# Patient Record
Sex: Female | Born: 1950 | Race: White | Hispanic: No | Smoking: Never smoker
Health system: Southern US, Community
[De-identification: ages and names within clinical notes are randomized; demographics above are authoritative.]

## PROBLEM LIST (undated history)

## (undated) DIAGNOSIS — K635 Polyp of colon: Secondary | ICD-10-CM

## (undated) DIAGNOSIS — E782 Mixed hyperlipidemia: Secondary | ICD-10-CM

## (undated) DIAGNOSIS — D72819 Decreased white blood cell count, unspecified: Principal | ICD-10-CM

## (undated) DIAGNOSIS — B009 Herpesviral infection, unspecified: Secondary | ICD-10-CM

## (undated) DIAGNOSIS — K2980 Duodenitis without bleeding: Secondary | ICD-10-CM

## (undated) DIAGNOSIS — Z1231 Encounter for screening mammogram for malignant neoplasm of breast: Secondary | ICD-10-CM

## (undated) DIAGNOSIS — R051 Acute cough: Secondary | ICD-10-CM

## (undated) DIAGNOSIS — K219 Gastro-esophageal reflux disease without esophagitis: Secondary | ICD-10-CM

## (undated) DIAGNOSIS — I10 Essential (primary) hypertension: Secondary | ICD-10-CM

## (undated) DIAGNOSIS — M8589 Other specified disorders of bone density and structure, multiple sites: Principal | ICD-10-CM

## (undated) DIAGNOSIS — Z79899 Other long term (current) drug therapy: Secondary | ICD-10-CM

## (undated) DIAGNOSIS — Z1211 Encounter for screening for malignant neoplasm of colon: Secondary | ICD-10-CM

## (undated) DIAGNOSIS — D709 Neutropenia, unspecified: Principal | ICD-10-CM

## (undated) DIAGNOSIS — Z8619 Personal history of other infectious and parasitic diseases: Secondary | ICD-10-CM

## (undated) DIAGNOSIS — E559 Vitamin D deficiency, unspecified: Secondary | ICD-10-CM

## (undated) DIAGNOSIS — Z9882 Breast implant status: Secondary | ICD-10-CM

## (undated) DIAGNOSIS — K2210 Ulcer of esophagus without bleeding: Secondary | ICD-10-CM

## (undated) DIAGNOSIS — K8020 Calculus of gallbladder without cholecystitis without obstruction: Secondary | ICD-10-CM

## (undated) DIAGNOSIS — D131 Benign neoplasm of stomach: Secondary | ICD-10-CM

## (undated) DIAGNOSIS — M1990 Unspecified osteoarthritis, unspecified site: Secondary | ICD-10-CM

## (undated) DIAGNOSIS — D649 Anemia, unspecified: Secondary | ICD-10-CM

## (undated) DIAGNOSIS — K5190 Ulcerative colitis, unspecified, without complications: Secondary | ICD-10-CM

## (undated) HISTORY — PX: FOOT SURGERY: SHX648

## (undated) HISTORY — PX: COSMETIC SURGERY: SHX468

## (undated) HISTORY — PX: COLONOSCOPY: SHX174

## (undated) HISTORY — PX: BREAST SURGERY: SHX581

## (undated) HISTORY — PX: AUGMENTATION MAMMAPLASTY: SUR837

## (undated) HISTORY — PX: OTHER SURGICAL HISTORY: SHX169

## (undated) HISTORY — PX: TUBAL LIGATION: SHX77

---

## 1998-04-11 ENCOUNTER — Ambulatory Visit (HOSPITAL_COMMUNITY): Admission: RE | Admit: 1998-04-11 | Discharge: 1998-04-11 | Payer: Self-pay | Admitting: Gastroenterology

## 2004-07-24 ENCOUNTER — Ambulatory Visit (HOSPITAL_COMMUNITY): Admission: RE | Admit: 2004-07-24 | Discharge: 2004-07-25 | Payer: Self-pay | Admitting: Specialist

## 2004-07-24 ENCOUNTER — Encounter (INDEPENDENT_AMBULATORY_CARE_PROVIDER_SITE_OTHER): Payer: Self-pay | Admitting: Specialist

## 2004-10-22 ENCOUNTER — Ambulatory Visit: Payer: Self-pay

## 2005-05-14 ENCOUNTER — Other Ambulatory Visit: Admission: RE | Admit: 2005-05-14 | Discharge: 2005-05-14 | Payer: Self-pay | Admitting: Family Medicine

## 2005-05-21 ENCOUNTER — Encounter: Admission: RE | Admit: 2005-05-21 | Discharge: 2005-05-21 | Payer: Self-pay | Admitting: Family Medicine

## 2006-05-27 ENCOUNTER — Other Ambulatory Visit: Admission: RE | Admit: 2006-05-27 | Discharge: 2006-05-27 | Payer: Self-pay | Admitting: Family Medicine

## 2006-07-14 ENCOUNTER — Encounter: Admission: RE | Admit: 2006-07-14 | Discharge: 2006-07-14 | Payer: Self-pay | Admitting: Family Medicine

## 2007-05-30 ENCOUNTER — Other Ambulatory Visit: Admission: RE | Admit: 2007-05-30 | Discharge: 2007-05-30 | Payer: Self-pay | Admitting: Family Medicine

## 2007-07-19 ENCOUNTER — Encounter: Admission: RE | Admit: 2007-07-19 | Discharge: 2007-07-19 | Payer: Self-pay | Admitting: Family Medicine

## 2008-05-29 ENCOUNTER — Other Ambulatory Visit: Admission: RE | Admit: 2008-05-29 | Discharge: 2008-05-29 | Payer: Self-pay | Admitting: Internal Medicine

## 2008-09-12 ENCOUNTER — Encounter: Admission: RE | Admit: 2008-09-12 | Discharge: 2008-09-12 | Payer: Self-pay | Admitting: Family Medicine

## 2009-05-29 ENCOUNTER — Other Ambulatory Visit: Admission: RE | Admit: 2009-05-29 | Discharge: 2009-05-29 | Payer: Self-pay | Admitting: Family Medicine

## 2010-11-26 ENCOUNTER — Encounter
Admission: RE | Admit: 2010-11-26 | Discharge: 2010-11-26 | Payer: Self-pay | Source: Home / Self Care | Attending: Internal Medicine | Admitting: Internal Medicine

## 2011-04-23 NOTE — Op Note (Signed)
Brianna Stevenson, Brianna Stevenson                             ACCOUNT NO.:  [id number]   MEDICAL RECORD NO.:  [id number]                   PATIENT TYPE:  OIB   LOCATION:  2550                                 FACILITY:  [facility]   PHYSICIAN:  Odella Aquas. Towanda Malkin, M.D.           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  She has 4+  diastasis recti.   PROCEDURES PLANNED:  1. Abdominoplasty with repair of diastasis recti.  2. Liposuction of the upper abdomen as well as the lateral abdomen and     ischial areas, inner and outer thighs.   ANESTHESIA:  General.   Preoperatively the patient was set up and drawn for all the areas of  concern.  A W-plasty incision was also outlined.  She then was placed back  on the operating room table.  She underwent general anesthesia and intubated  orally.  Prep was done to the chest, breast, abdominal, groin, and thigh  areas all the way down to the legs and feet with Hibiclens soap and solution  and walled off with sterile towels and drapes so as to make a sterile field.  Prep was done using the above.  Sterile towels and drapes were placed.  Tumescent was injected locally into all the areas of concern.  The W-plasty  incision was opened with a #15 blade down through the underlying  subcutaneous tissue.  Using a Bovie unit through Scarpa's fascia, we were  able to the tissue overlying the rectus and abdominal fascia.  A plane was  dissected up to the umbilicus.  The umbilicus was then released on its own  pedicle and the dissection carried out to the xiphoid process as well as the  upper right and left  quadrants.  After proper hemostasis, a liposuction was  fashioned to the right and left ischial areas, removing over 400 mL of fatty  tissue.  Next the upper abdomen was also liposuctioned lightly.  Examination  and after hemostasis was maintained showed severe diastasis recti.  This was  repaired with a running #1 Prolene suture from the xiphoid process to the  umbilicus and from the umbilicus to the suprapubic area with locking #1  Prolene suture.  Next the patient was placed in jackknife position and the  excess abdominal skin was excised.  Then after proper hemostasis the flaps  were laid in and secured with 2-0 Monocryl x2 layers and a running  subcuticular stitch of 3-0 Monocryl.  The new opening was made for the  umbilicus, and it was  brought through.  The area was defatted around it.  It  was secured with 3-0 Monocryl in the subcutaneous plane and then a running  subcuticular stitch of 3-0 Monocryl.  Next attention was drawn to the pubic  area.  A large Hemovac drain was placed through there, secured with 3-0  Prolene.  It was placed through the whole area of dissection of the abdomen.  Next a liposuction was fashioned over the right and left lateral inner thigh  using a [place] catheter 28-3 and 4's, removing copious amounts of  lipodystrophy with good symmetry, care being not to go too close to the skin  areas in the superficial planes.  After this the wounds were reclosed with 4-  0 Prolene, soft fluffy dressing applied to all the areas, Xeroform, 4 x 4's,  ABD, Hypafix tape, and an extra large abdominal support garment.  She  withstood all the procedures very well, was taken to recovery in excellent  condition.   ESTIMATED BLOOD LOSS:  400 mL.   COMPLICATIONS:  None.   The patient is to be admitted for overnight stay and then being discharged  on tomorrow.   ADDENDUM:  Prior to starting the procedure a Foley catheter was placed under  sterile conditions.                                                Odella Aquas. Towanda Malkin, M.D.    Elie Confer  D:  07/24/2004  T:  07/24/2004  Job:  [id number]

## 2011-04-23 NOTE — Discharge Summary (Signed)
NAME:  Brianna Stevenson, SHUTTER                             ACCOUNT NO.:  [id number]   MEDICAL RECORD NO.:  [id number]                   PATIENT TYPE:  OIB   LOCATION:  5708                                 FACILITY:  [facility]   PHYSICIAN:  Odella Aquas. Towanda Malkin, M.D.           DATE OF BIRTH:  01/11/1951   DATE OF ADMISSION:  07/24/2004  DATE OF DISCHARGE:                                 DISCHARGE SUMMARY   This is a 60 year old lady who has increased abdominal dermatochalasis with  diastasis recti, panniculus with lipodystrophy involving the ischial as well  as the right and left inner and outer thighs.  The patient underwent  abdominoplasty with repair of diastasis recti, liposuction of the ischial  areas as well as the right and left inner and outer thigh areas on July 24, 2004.  Postoperatively, she has done well, hemodynamically is very  stable, feels good, alert and oriented, urine output has been fine.  She is  being discharged today, July 25, 2004.   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   CONDITION ON DISCHARGE:  Improved.   COMPLICATIONS:  None.   FOLLOW UP:  Follow up in my office next Tuesday, the patient is to call the  office.   DISCHARGE MEDICATIONS:  She also has her preop and postop medications which  include Tylox 1 tablet by mouth every 4 hours as needed for pain, #30,  Duricef 500 mg twice a day for five days.  The patient is to call for any  medical problems.                                                Odella Aquas. Towanda Malkin, M.D.    Elie Confer  D:  07/25/2004  T:  07/25/2004  Job:  [id number]

## 2011-04-27 ENCOUNTER — Ambulatory Visit: Payer: Self-pay | Admitting: Family Medicine

## 2011-11-15 ENCOUNTER — Other Ambulatory Visit: Payer: Self-pay | Admitting: Internal Medicine

## 2011-11-15 DIAGNOSIS — Z1231 Encounter for screening mammogram for malignant neoplasm of breast: Secondary | ICD-10-CM

## 2011-12-06 ENCOUNTER — Ambulatory Visit
Admission: RE | Admit: 2011-12-06 | Discharge: 2011-12-06 | Disposition: A | Discharge status: Home / Self Care | Payer: Commercial Managed Care - PPO | Source: Ambulatory Visit | Attending: Internal Medicine | Admitting: Internal Medicine

## 2011-12-06 DIAGNOSIS — Z1231 Encounter for screening mammogram for malignant neoplasm of breast: Secondary | ICD-10-CM

## 2012-10-17 ENCOUNTER — Other Ambulatory Visit: Payer: Self-pay | Admitting: Internal Medicine

## 2012-10-17 DIAGNOSIS — Z1231 Encounter for screening mammogram for malignant neoplasm of breast: Secondary | ICD-10-CM

## 2012-12-07 ENCOUNTER — Ambulatory Visit
Admission: RE | Admit: 2012-12-07 | Discharge: 2012-12-07 | Disposition: A | Discharge status: Home / Self Care | Payer: Commercial Managed Care - PPO | Source: Ambulatory Visit | Attending: Internal Medicine | Admitting: Internal Medicine

## 2012-12-07 DIAGNOSIS — Z1231 Encounter for screening mammogram for malignant neoplasm of breast: Secondary | ICD-10-CM

## 2013-09-03 ENCOUNTER — Other Ambulatory Visit: Payer: Self-pay

## 2013-09-03 DIAGNOSIS — Z1231 Encounter for screening mammogram for malignant neoplasm of breast: Secondary | ICD-10-CM

## 2013-09-03 DIAGNOSIS — Z9882 Breast implant status: Secondary | ICD-10-CM

## 2013-12-07 ENCOUNTER — Ambulatory Visit
Admission: RE | Admit: 2013-12-07 | Discharge: 2013-12-07 | Disposition: A | Discharge status: Home / Self Care | Payer: 59 | Source: Ambulatory Visit

## 2013-12-07 DIAGNOSIS — Z9882 Breast implant status: Secondary | ICD-10-CM

## 2013-12-07 DIAGNOSIS — Z1231 Encounter for screening mammogram for malignant neoplasm of breast: Secondary | ICD-10-CM

## 2014-12-09 ENCOUNTER — Other Ambulatory Visit: Payer: Self-pay

## 2014-12-09 DIAGNOSIS — Z1231 Encounter for screening mammogram for malignant neoplasm of breast: Secondary | ICD-10-CM

## 2014-12-17 ENCOUNTER — Ambulatory Visit
Admission: RE | Admit: 2014-12-17 | Discharge: 2014-12-17 | Disposition: A | Discharge status: Home / Self Care | Payer: 59 | Source: Ambulatory Visit

## 2014-12-17 DIAGNOSIS — Z1231 Encounter for screening mammogram for malignant neoplasm of breast: Secondary | ICD-10-CM

## 2015-10-26 ENCOUNTER — Encounter: Payer: Self-pay | Admitting: Gynecology

## 2015-10-26 ENCOUNTER — Ambulatory Visit
Admission: EM | Admit: 2015-10-26 | Discharge: 2015-10-26 | Disposition: A | Discharge status: Home / Self Care | Payer: 59 | Attending: Family Medicine | Admitting: Family Medicine

## 2015-10-26 DIAGNOSIS — B349 Viral infection, unspecified: Secondary | ICD-10-CM | POA: Diagnosis not present

## 2015-10-26 HISTORY — DX: Vitamin D deficiency, unspecified: E55.9

## 2015-10-26 HISTORY — DX: Essential (primary) hypertension: I10

## 2015-10-26 HISTORY — DX: Mixed hyperlipidemia: E78.2

## 2015-10-26 HISTORY — DX: Ulcerative colitis, unspecified, without complications: K51.90

## 2015-10-26 LAB — RAPID STREP SCREEN (MED CTR MEBANE ONLY): Streptococcus, Group A Screen (Direct): NEGATIVE

## 2015-10-26 MED ORDER — IPRATROPIUM-ALBUTEROL 0.5-2.5 (3) MG/3ML IN SOLN
3.0000 mL | Freq: Once | RESPIRATORY_TRACT | Status: AC
  Administered 2015-10-26: 3 mL via RESPIRATORY_TRACT

## 2015-10-26 MED ORDER — FLUTICASONE PROPIONATE 50 MCG/ACT NA SUSP: 2.0000 | Freq: Every day | NASAL | Status: AC

## 2015-10-26 MED ORDER — KETOROLAC TROMETHAMINE 60 MG/2ML IM SOLN
60.0000 mg | Freq: Once | INTRAMUSCULAR | Status: AC
  Administered 2015-10-26: 60 mg via INTRAMUSCULAR

## 2015-10-26 NOTE — ED Notes (Addendum)
Patient c/o very sore throat / congestion and pulled muscle in back while shaving her legs.

## 2015-10-26 NOTE — ED Provider Notes (Signed)
CSN: [id number]     Arrival date & time 10/26/15  [id number] History   First MD Initiated Contact with Patient 10/26/15 [phone]     Chief Complaint  Patient presents with  . Sore Throat   (Consider location/radiation/quality/duration/timing/severity/associated sxs/prior Treatment) HPI  64 yo F with cough, congestion, sore throat  X 3 days. Got Doxycyline from PCP by phone on day 1- Rx continues. Also takes ampicillin daily for roseacea. Reports unable to eat x 1 day 2 sore throat. Took Aleve x 1 yesterday. Has Chertussin from husband she is using for cough- insists no improvement. Non smoker. No hx allergies Husband with same  Past Medical History  Diagnosis Date  . Ulcerative colitis ([place])   . Vitamin D deficiency disease   . Hypertension   . Mixed hyperlipidemia    Past Surgical History  Procedure Laterality Date  . Cesarean section    . Foot surgery    . Tummy tuck    . Cosmetic surgery      bilateral arms  . Planter wart    . Breast surgery     No family history on file. Social History  Substance Use Topics  . Smoking status: Never Smoker   . Smokeless tobacco: None  . Alcohol Use: No   OB History    No data available     Review of Systems  Constitutional: No fever.  Eyes: No visual changes. ENT: positive  sore throat. Cardiovascular:Negative for chest pain/palpitations Respiratory: Negative for shortness of breath, has cough Gastrointestinal: No abdominal pain. No nausea,vomiting, diarrhea Genitourinary: Negative for dysuria. Normal urination. Musculoskeletal: Positive for right low  back pain.-she believes pulled a muscle while shaving her legs yesterday  FROM extremities without pain Skin: Negative for rash Neurological: Negative for headache, focal weakness or numbness  Allergies  Review of patient's allergies indicates no known allergies.  Home Medications   Prior to Admission medications   Medication Sig Start Date End Date Taking? Authorizing Provider   amLODipine-benazepril (LOTREL) 5-20 MG capsule Take 1 capsule by mouth daily.   Yes Historical Provider, MD  ampicillin (PRINCIPEN) 500 MG capsule Take 500 mg by mouth 4 (four) times daily.   Yes Historical Provider, MD  doxycycline (VIBRAMYCIN) 100 MG capsule Take 100 mg by mouth 2 (two) times daily.   Yes Historical Provider, MD  ergocalciferol (VITAMIN D2) 50000 UNITS capsule Take 50,000 Units by mouth once a week.   Yes Historical Provider, MD  folic acid (FOLVITE) 1 MG tablet Take 1 mg by mouth daily.   Yes Historical Provider, MD  hydrochlorothiazide (HYDRODIURIL) 12.5 MG tablet Take 12.5 mg by mouth daily.   Yes Historical Provider, MD  meloxicam (MOBIC) 7.5 MG tablet Take 7.5 mg by mouth daily.   Yes Historical Provider, MD  metroNIDAZOLE (METROGEL) 1 % gel Apply topically daily.   Yes Historical Provider, MD  omeprazole (PRILOSEC) 40 MG capsule Take 40 mg by mouth daily.   Yes Historical Provider, MD  rosuvastatin (CRESTOR) 10 MG tablet Take 10 mg by mouth daily.   Yes Historical Provider, MD  sulfaSALAzine (AZULFIDINE) 500 MG tablet Take 500 mg by mouth 4 (four) times daily.   Yes Historical Provider, MD  fluticasone (FLONASE) 50 MCG/ACT nasal spray Place 2 sprays into both nostrils daily. 10/26/15   Jan Fireman, PA-C   Meds Ordered and Administered this Visit   Medications  ketorolac (TORADOL) injection 60 mg (60 mg Intramuscular Given 10/26/15 0928)  ipratropium-albuterol (DUONEB) 0.5-2.5 (3) MG/3ML nebulizer  solution 3 mL (3 mLs Nebulization Given 10/26/15 0928)    BP 135/76 mmHg  Pulse 83  Temp(Src) 98.4 F (36.9 C) (Oral)  Ht 5\' 1"  (1.549 m)  Wt 170 lb (77.111 kg)  BMI 32.14 kg/m2  SpO2 98% No data found.   Physical Exam  General: NAD, does not appear toxic, tired and not coping well-undermedicated for discomfort HEENT:normocephalic,atraumatic, mucous membranes moist,grossly normal hearing oopharnyx pale not eyrthematous, no tonsillar enlargement Eyes: EOMI,  conjunctiva clear, conjugate gaze Neck: supple,no lymphadenopathy Resp : CT A, bilat; normal respiratory effort, cough Card : RRR Back: FROM, amb without assistance, on and off table, palpable mild tenderness right low back Abd:  Not distended Skin: no rash, skin intact MSK: no deformities, ambulatory without assistance Neuro : good attention,recall-good memory, no focal neuro deficits Psych: speech and behavior appropriate   ED Course  Procedures (including critical care time)  Labs Review Labs Reviewed  RAPID STREP SCREEN (NOT AT [facility])  CULTURE, GROUP A STREP ([facility] ONLY)   Stat Neg  Imaging Review No results found.      MDM   1. Viral syndrome    Plan:   Diagnosis reviewed with patient--Seasonal Allergies suspected  Along with Viral syndrome  Rx Fluticasone,loratadine ; - twice daily for 3 days then reduce to daily   Add OTC Guaifenesin DM to thin mucous;increase po fluids  Recommend supportive treatment with OTC tylenol/ibuprofen/fluids-heat pad to back on low prn while awake  Risks ,benefits,potential side effects reviewed  Seek additional medical care if symptoms worsen or don't improve  Call in 3 days for final Strep report   Jan Fireman, PA-C 11/01/15 [phone]

## 2015-10-26 NOTE — Discharge Instructions (Signed)
Claritin 10 mg 1   day   Fluticasone 1 spray per nostril twice daily for 3 days then reduce to daily  Ibuporfen 200 mg     3 tablets   3 x day with meals  X 3 days for discomfort   STOP IF YOU GET STOMACH UPSET  Tylenol 325 or 500 mg 2 tablets every 6 hours as needed for pain alternating with ibuprofen  Robitussin DM  ( Guaifenesin DM )  1 tsp every 4 hours -- liquefy  mucous and decrease coughing  Ice pack to back for 2 -3 days  Then OK to  Try heat pad   Do Not go to bedrest...stay up and active---steamy showers---bubble bath  :) Viral Infections A viral infection can be caused by different types of viruses.Most viral infections are not serious and resolve on their own. However, some infections may cause severe symptoms and may lead to further complications. SYMPTOMS Viruses can frequently cause:  Minor sore throat.  Aches and pains.  Headaches.  Runny nose.  Different types of rashes.  Watery eyes.  Tiredness.  Cough.  Loss of appetite.  Gastrointestinal infections, resulting in nausea, vomiting, and diarrhea. These symptoms do not respond to antibiotics because the infection is not caused by bacteria. However, you might catch a bacterial infection following the viral infection. This is sometimes called a "superinfection." Symptoms of such a bacterial infection may include:  Worsening sore throat with pus and difficulty swallowing.  Swollen neck glands.  Chills and a high or persistent fever.  Severe headache.  Tenderness over the sinuses.  Persistent overall ill feeling (malaise), muscle aches, and tiredness (fatigue).  Persistent cough.  Yellow, green, or brown mucus production with coughing. HOME CARE INSTRUCTIONS   Only take over-the-counter or prescription medicines for pain, discomfort, diarrhea, or fever as directed by your caregiver.  Drink enough water and fluids to keep your urine clear or pale yellow. Sports drinks can provide valuable  electrolytes, sugars, and hydration.  Get plenty of rest and maintain proper nutrition. Soups and broths with crackers or rice are fine. SEEK IMMEDIATE MEDICAL CARE IF:   You have severe headaches, shortness of breath, chest pain, neck pain, or an unusual rash.  You have uncontrolled vomiting, diarrhea, or you are unable to keep down fluids.  You or your child has an oral temperature above 102 F (38.9 C), not controlled by medicine.  Your baby is older than 3 months with a rectal temperature of 102 F (38.9 C) or higher.  Your baby is 59 months old or younger with a rectal temperature of 100.4 F (38 C) or higher. MAKE SURE YOU:   Understand these instructions.  Will watch your condition.  Will get help right away if you are not doing well or get worse.   This information is not intended to replace advice given to you by your health care provider. Make sure you discuss any questions you have with your health care provider.   Document Released: 09/01/2005 Document Revised: 02/14/2012 Document Reviewed: 04/30/2015 Elsevier Interactive Patient Education [employer].

## 2015-10-28 LAB — CULTURE, GROUP A STREP (THRC)

## 2015-11-25 ENCOUNTER — Other Ambulatory Visit: Payer: Self-pay | Admitting: Family Medicine

## 2015-11-25 ENCOUNTER — Other Ambulatory Visit: Payer: Self-pay

## 2015-11-25 DIAGNOSIS — Z780 Asymptomatic menopausal state: Secondary | ICD-10-CM

## 2015-11-25 DIAGNOSIS — Z1231 Encounter for screening mammogram for malignant neoplasm of breast: Secondary | ICD-10-CM

## 2015-12-24 ENCOUNTER — Other Ambulatory Visit: Payer: Self-pay | Admitting: Family Medicine

## 2015-12-24 DIAGNOSIS — Z780 Asymptomatic menopausal state: Secondary | ICD-10-CM

## 2015-12-26 ENCOUNTER — Ambulatory Visit
Admission: RE | Admit: 2015-12-26 | Discharge: 2015-12-26 | Disposition: A | Discharge status: Home / Self Care | Payer: 59 | Source: Ambulatory Visit

## 2015-12-26 ENCOUNTER — Ambulatory Visit
Admission: RE | Admit: 2015-12-26 | Discharge: 2015-12-26 | Disposition: A | Discharge status: Home / Self Care | Payer: 59 | Source: Ambulatory Visit | Attending: Family Medicine | Admitting: Family Medicine

## 2015-12-26 DIAGNOSIS — Z780 Asymptomatic menopausal state: Secondary | ICD-10-CM

## 2015-12-26 DIAGNOSIS — Z1231 Encounter for screening mammogram for malignant neoplasm of breast: Secondary | ICD-10-CM

## 2016-10-06 NOTE — Discharge Instructions (Signed)
[facility] °[facility] ° °POST OPERATIVE INSTRUCTIONS FOR DR. TROXLER AND DR. FOWLER °[facility] ° ° °1. Take your medication as prescribed.  Pain medication should be taken only as needed. ° °2. Keep the dressing clean, dry and intact. ° °3. Keep your foot elevated above the heart level for the first 48 hours. ° °4. Walking to the bathroom and brief periods of walking are acceptable, unless we have instructed you to be non-weight bearing. ° °5. Always wear your post-op shoe when walking.  Always use your crutches if you are to be non-weight bearing. ° °6. Do not take a shower. Baths are permissible as long as the foot is kept out of the water.  ° °7. Every hour you are awake:  °- Bend your knee 15 times. °- Flex foot 15 times °- Massage calf 15 times ° °8. Call [facility] ([phone]) if any of the following problems occur: °- You develop a temperature or fever. °- The bandage becomes saturated with blood. °- Medication does not stop your pain. °- Injury of the foot occurs. °- Any symptoms of infection including redness, odor, or red streaks running from wound. ° °General Anesthesia, Adult, Care After °Refer to this sheet in the next few weeks. These instructions provide you with information on caring for yourself after your procedure. Your health care provider may also give you more specific instructions. Your treatment has been planned according to current medical practices, but problems sometimes occur. Call your health care provider if you have any problems or questions after your procedure. °WHAT TO EXPECT AFTER THE PROCEDURE °After the procedure, it is typical to experience: °· Sleepiness. °· Nausea and vomiting. °HOME CARE INSTRUCTIONS °· For the first 24 hours after general anesthesia: °¨ Have a responsible person with you. °¨ Do not drive a car. If you are alone, do not take public transportation. °¨ Do not drink alcohol. °¨ Do not take  medicine that has not been prescribed by your health care provider. °¨ Do not sign important papers or make important decisions. °¨ You may resume a normal diet and activities as directed by your health care provider. °· Change bandages (dressings) as directed. °· If you have questions or problems that seem related to general anesthesia, call the hospital and ask for the anesthetist or anesthesiologist on call. °SEEK MEDICAL CARE IF: °· You have nausea and vomiting that continue the day after anesthesia. °· You develop a rash. °SEEK IMMEDIATE MEDICAL CARE IF:  °· You have difficulty breathing. °· You have chest pain. °· You have any allergic problems. °  °This information is not intended to replace advice given to you by your health care provider. Make sure you discuss any questions you have with your health care provider. °  °Document Released: 02/28/2001 Document Revised: 12/13/2014 Document Reviewed: 03/22/2012 °Elsevier Interactive Patient Education ©2016 Elsevier Inc. ° °

## 2016-10-14 ENCOUNTER — Encounter
Admission: RE | Disposition: A | Discharge status: Home / Self Care | Payer: Self-pay | Source: Ambulatory Visit | Attending: Podiatry

## 2016-10-14 ENCOUNTER — Ambulatory Visit
Admission: RE | Admit: 2016-10-14 | Discharge: 2016-10-14 | Disposition: A | Discharge status: Home / Self Care | Payer: 59 | Source: Ambulatory Visit | Attending: Podiatry | Admitting: Podiatry

## 2016-10-14 ENCOUNTER — Ambulatory Visit: Payer: 59 | Admitting: Anesthesiology

## 2016-10-14 DIAGNOSIS — M2041 Other hammer toe(s) (acquired), right foot: Secondary | ICD-10-CM | POA: Diagnosis present

## 2016-10-14 DIAGNOSIS — M19071 Primary osteoarthritis, right ankle and foot: Secondary | ICD-10-CM | POA: Insufficient documentation

## 2016-10-14 DIAGNOSIS — G8929 Other chronic pain: Secondary | ICD-10-CM | POA: Diagnosis not present

## 2016-10-14 DIAGNOSIS — M2021 Hallux rigidus, right foot: Secondary | ICD-10-CM | POA: Insufficient documentation

## 2016-10-14 DIAGNOSIS — I10 Essential (primary) hypertension: Secondary | ICD-10-CM | POA: Insufficient documentation

## 2016-10-14 HISTORY — DX: Unspecified osteoarthritis, unspecified site: M19.90

## 2016-10-14 HISTORY — PX: METATARSAL OSTEOTOMY: SHX1641

## 2016-10-14 HISTORY — DX: Gastro-esophageal reflux disease without esophagitis: K21.9

## 2016-10-14 HISTORY — PX: ARTHRODESIS METATARSALPHALANGEAL JOINT (MTPJ): SHX6566

## 2016-10-14 HISTORY — DX: Anemia, unspecified: D64.9

## 2016-10-14 HISTORY — PX: HAMMER TOE SURGERY: SHX385

## 2016-10-14 SURGERY — FUSION, JOINT, GREAT TOE
Anesthesia: Regional | Site: Foot | Laterality: Right | Wound class: Clean

## 2016-10-14 MED ORDER — BUPIVACAINE HCL (PF) 0.5 % IJ SOLN
INTRAMUSCULAR | Status: DC | PRN
  Administered 2016-10-14: 4 mL

## 2016-10-14 MED ORDER — DEXAMETHASONE SODIUM PHOSPHATE 4 MG/ML IJ SOLN
INTRAMUSCULAR | Status: DC | PRN
  Administered 2016-10-14: 4 mg via INTRAVENOUS

## 2016-10-14 MED ORDER — LIDOCAINE HCL (CARDIAC) 20 MG/ML IV SOLN
INTRAVENOUS | Status: DC | PRN
  Administered 2016-10-14: 50 mg via INTRATRACHEAL

## 2016-10-14 MED ORDER — EPHEDRINE SULFATE 50 MG/ML IJ SOLN
INTRAMUSCULAR | Status: DC | PRN
  Administered 2016-10-14 (×3): 10 mg via INTRAVENOUS

## 2016-10-14 MED ORDER — OXYCODONE HCL 5 MG PO TABS: 5.0000 mg | ORAL_TABLET | Freq: Once | ORAL | Status: DC | PRN

## 2016-10-14 MED ORDER — PROMETHAZINE HCL 25 MG/ML IJ SOLN: 6.2500 mg | INTRAMUSCULAR | Status: DC | PRN

## 2016-10-14 MED ORDER — OXYCODONE HCL 5 MG/5ML PO SOLN: 5.0000 mg | Freq: Once | ORAL | Status: DC | PRN

## 2016-10-14 MED ORDER — PROPOFOL 10 MG/ML IV BOLUS
INTRAVENOUS | Status: DC | PRN
  Administered 2016-10-14: 180 mg via INTRAVENOUS

## 2016-10-14 MED ORDER — OXYCODONE-ACETAMINOPHEN 7.5-325 MG PO TABS: 1.0000 | ORAL_TABLET | ORAL | 0 refills | Status: DC | PRN

## 2016-10-14 MED ORDER — ROPIVACAINE HCL 5 MG/ML IJ SOLN
INTRAMUSCULAR | Status: DC | PRN
  Administered 2016-10-14: 200 mg via PERINEURAL

## 2016-10-14 MED ORDER — LACTATED RINGERS IV SOLN
INTRAVENOUS | Status: DC
  Administered 2016-10-14: 09:00:00 via INTRAVENOUS

## 2016-10-14 MED ORDER — DEXTROSE 5 % IV SOLN
2000.0000 mg | Freq: Once | INTRAVENOUS | Status: AC
  Administered 2016-10-14: 2000 mg via INTRAVENOUS

## 2016-10-14 MED ORDER — PROMETHAZINE HCL 12.5 MG PO TABS: 12.5000 mg | ORAL_TABLET | Freq: Four times a day (QID) | ORAL | 0 refills | Status: DC | PRN

## 2016-10-14 MED ORDER — MIDAZOLAM HCL 2 MG/2ML IJ SOLN
INTRAMUSCULAR | Status: DC | PRN
  Administered 2016-10-14 (×2): 2 mg via INTRAVENOUS

## 2016-10-14 MED ORDER — MEPERIDINE HCL 25 MG/ML IJ SOLN: 6.2500 mg | INTRAMUSCULAR | Status: DC | PRN

## 2016-10-14 MED ORDER — FENTANYL CITRATE (PF) 100 MCG/2ML IJ SOLN
INTRAMUSCULAR | Status: DC | PRN
  Administered 2016-10-14: 100 ug via INTRAVENOUS

## 2016-10-14 MED ORDER — HYDROMORPHONE HCL 1 MG/ML IJ SOLN: 0.2500 mg | INTRAMUSCULAR | Status: DC | PRN

## 2016-10-14 MED ORDER — ONDANSETRON HCL 4 MG/2ML IJ SOLN
INTRAMUSCULAR | Status: DC | PRN
  Administered 2016-10-14: 4 mg via INTRAVENOUS

## 2016-10-14 SURGICAL SUPPLY — 98 items
0.045 K WIRE (Wire) ×1 IMPLANT
1.2 MM WIRE ×2 IMPLANT
19 MM CUP REAMER ×1 IMPLANT
19MM CONE REAMER ×1 IMPLANT
3.5 MM COUNTERSINK ×1 IMPLANT
APL SKNCLS STERI-STRIP NONHPOA (GAUZE/BANDAGES/DRESSINGS) ×1
BANDAGE ELASTIC 4 LF NS (GAUZE/BANDAGES/DRESSINGS) ×2 IMPLANT
BENZOIN TINCTURE PRP APPL 2/3 (GAUZE/BANDAGES/DRESSINGS) ×2 IMPLANT
BIT DRILL 1.7 LNG CANN (DRILL) ×1 IMPLANT
BIT DRILL 2 FENESTRATED (MISCELLANEOUS) IMPLANT
BIT DRILL CANNULATED 2.3MM (DRILL) IMPLANT
BIT DRILLL 2 FENESTRATED (MISCELLANEOUS) ×1
BLADE CRESCENTIC (BLADE) IMPLANT
BLADE MED AGGRESSIVE (BLADE) IMPLANT
BLADE MINI RND TIP GREEN BEAV (BLADE) IMPLANT
BLADE OSC/SAGITTAL 5.5X25 (BLADE) IMPLANT
BLADE OSC/SAGITTAL MD 5.5X18 (BLADE) IMPLANT
BLADE OSC/SAGITTAL MD 9X18.5 (BLADE) ×1 IMPLANT
BLADE OSCILLATING/SAGITTAL (BLADE) ×2
BLADE SW THK.38XMED LNG THN (BLADE) IMPLANT
BNDG CMPR 75X41 PLY HI ABS (GAUZE/BANDAGES/DRESSINGS) ×1
BNDG CMPR MED 5X4 ELC HKLP NS (GAUZE/BANDAGES/DRESSINGS) ×1
BNDG ESMARK 4X12 TAN STRL LF (GAUZE/BANDAGES/DRESSINGS) ×2 IMPLANT
BNDG GAUZE 4.5X4.1 6PLY STRL (MISCELLANEOUS) ×2 IMPLANT
BNDG STRETCH 4X75 STRL LF (GAUZE/BANDAGES/DRESSINGS) ×2 IMPLANT
BUR EGG 4X8 MED (BURR) IMPLANT
BUR STRYKR EGG 5.0 (BURR) IMPLANT
BUR SURG 4X8 MED (BURR) IMPLANT
BURR SURG 4X8 MED (BURR) ×2
CANISTER SUCT 1200ML W/VALVE (MISCELLANEOUS) ×2 IMPLANT
CAST PADDING 3X4FT ST 30246 (SOFTGOODS)
CNTRSNK DRL 2 HDLS SCR (MISCELLANEOUS) IMPLANT
COUNTERSINK 2.0 (MISCELLANEOUS) ×2
COVER LIGHT HANDLE UNIVERSAL (MISCELLANEOUS) ×4 IMPLANT
COVER PIN YLW 0.028-062 (MISCELLANEOUS) IMPLANT
CUFF TOURN SGL QUICK 18 (TOURNIQUET CUFF) IMPLANT
DRAPE FLUOR MINI C-ARM 54X84 (DRAPES) ×2 IMPLANT
DRILL CANNULATED 2.3MM (DRILL) ×2
DRILL WIRE PASS (DRILL) IMPLANT
DURAPREP 26ML APPLICATOR (WOUND CARE) ×2 IMPLANT
GAUZE PETRO XEROFOAM 1X8 (MISCELLANEOUS) ×2 IMPLANT
GAUZE SPONGE 4X4 12PLY STRL (GAUZE/BANDAGES/DRESSINGS) ×2 IMPLANT
GLOVE BIO SURGEON STRL SZ8 (GLOVE) ×2 IMPLANT
GOWN STRL REUS W/ TWL LRG LVL3 (GOWN DISPOSABLE) ×1 IMPLANT
GOWN STRL REUS W/ TWL XL LVL3 (GOWN DISPOSABLE) ×1 IMPLANT
GOWN STRL REUS W/TWL LRG LVL3 (GOWN DISPOSABLE) ×2
GOWN STRL REUS W/TWL XL LVL3 (GOWN DISPOSABLE) ×2
K-WIRE .9X150 (WIRE) ×8
K-WIRE DBL END TROCAR 6X.045 (WIRE) ×2
K-WIRE DBL END TROCAR 6X.062 (WIRE)
K-WIRE SMOOTH 1.6X150MM (WIRE) ×4
KIT ROOM TURNOVER OR (KITS) ×2 IMPLANT
KWIRE .9X150 (WIRE) IMPLANT
KWIRE DBL END TROCAR 6X.045 (WIRE) IMPLANT
KWIRE DBL END TROCAR 6X.062 (WIRE) IMPLANT
KWIRE SMOOTH 1.6X150MM (WIRE) IMPLANT
NDL HYPO 18GX1.5 BLUNT FILL (NEEDLE) IMPLANT
NDL HYPO 25GX1X1/2 BEV (NEEDLE) IMPLANT
NEEDLE HYPO 18GX1.5 BLUNT FILL (NEEDLE) IMPLANT
NEEDLE HYPO 25GX1X1/2 BEV (NEEDLE) IMPLANT
NS IRRIG 500ML POUR BTL (IV SOLUTION) ×2 IMPLANT
PACK EXTREMITY ARMC (MISCELLANEOUS) ×2 IMPLANT
PAD CAST CTTN 3X4 STRL (SOFTGOODS) IMPLANT
PAD GROUND ADULT SPLIT (MISCELLANEOUS) ×2 IMPLANT
PADDING CAST COTTON 3X4 STRL (SOFTGOODS)
PLATE MTP 0DEG RIGHT (Plate) ×1 IMPLANT
RASP SM TEAR CROSS CUT (RASP) ×2 IMPLANT
SCREW 2.7 NON LOCK (Screw) ×1 IMPLANT
SCREW 2.7 X 13 NON LOCK (Screw) ×3 IMPLANT
SCREW 2.7 X 15 NON LOCK (Screw) ×1 IMPLANT
SCREW 3.5 X 26ST (Screw) ×1 IMPLANT
SCREW CANN ST 3.5X26 (Screw) IMPLANT
SCREW HEADLESS 2.0X14MM (Screw) ×1 IMPLANT
SCREW HEADLESS SHRT THRD 2X10 (Screw) ×1 IMPLANT
SCREW LOCK PLATE R3 2.7X12 (Screw) ×1 IMPLANT
SCREW LOCK PLATE R3 2.7X15 (Screw) ×2 IMPLANT
SCREW LOCK PLATE R3 2.7X18 (Screw) ×1 IMPLANT
SPLINT CAST 1 STEP 4X30 (MISCELLANEOUS) ×2 IMPLANT
SPLINT FAST PLASTER 5X30 (CAST SUPPLIES)
SPLINT PLASTER CAST FAST 5X30 (CAST SUPPLIES) IMPLANT
STOCKINETTE STRL 6IN 960660 (GAUZE/BANDAGES/DRESSINGS) ×2 IMPLANT
STRAP BODY AND KNEE 60X3 (MISCELLANEOUS) ×2 IMPLANT
STRIP CLOSURE SKIN 1/4X4 (GAUZE/BANDAGES/DRESSINGS) ×2 IMPLANT
SUT ETHILON 4-0 (SUTURE)
SUT ETHILON 4-0 FS2 18XMFL BLK (SUTURE)
SUT ETHILON 5-0 FS-2 18 BLK (SUTURE) ×1 IMPLANT
SUT VIC AB 1 CT1 36 (SUTURE) IMPLANT
SUT VIC AB 2-0 CT1 27 (SUTURE)
SUT VIC AB 2-0 CT1 TAPERPNT 27 (SUTURE) IMPLANT
SUT VIC AB 2-0 SH 27 (SUTURE)
SUT VIC AB 2-0 SH 27XBRD (SUTURE) IMPLANT
SUT VIC AB 3-0 SH 27 (SUTURE)
SUT VIC AB 3-0 SH 27X BRD (SUTURE) IMPLANT
SUT VIC AB 4-0 FS2 27 (SUTURE) ×1 IMPLANT
SUT VICRYL AB 3-0 FS1 BRD 27IN (SUTURE) IMPLANT
SUTURE ETHLN 4-0 FS2 18XMF BLK (SUTURE) IMPLANT
SYRINGE 10CC LL (SYRINGE) IMPLANT
WIRE OLIVE SMOOTH 1.4MMX60MM (WIRE) ×1 IMPLANT

## 2016-10-14 NOTE — H&P (Signed)
H and P has been reviewed and no changes are noted.  

## 2016-10-14 NOTE — Op Note (Signed)
Operative note   Surgeon: Dr. Albertine Patricia, DPM.    Assistant: None    Preop diagnosis: 1. Hallux rigidus right foot 2. Plantar displaced second metatarsal right foot 3. Hammertoe deformity second toe right foot    Postop diagnosis: Same    Procedure:   1. Arthrodesis first metatarsal phalangeal joint with plate and screw fixation   2. Osteotomy second metatarsal with 2 screw fixation   3. Hammertoe repair second toe right foot with K wire fixation     EBL: Less than 10 cc    Anesthesia:general with a popliteal block delivered about anesthesia team. I delivered 5 cc of Marcaine plain around the base of the first metatarsal and second metatarsal at the end of the surgery.    Hemostasis: Ankle tourniquet 250 mils mercury for 90 minutes    Specimen: Degenerative bone and cartilage from right first metatarsophalangeal joint    Complications: None    Operative indications: Chronic pain and deformity and arthritic changes to the first MTP joint with secondary pain and discomfort and hammertoe formation and capsulitis to the second MTP joint. Conservative treatment proven effective.    Procedure:  Patient was brought into the OR and placed on the operating table in thesupine position. After anesthesia was obtained theright lower extremity was prepped and draped in usual sterile fashion.  Operative Report: This time attention directed to the dorsum of the right first MTP joint where a 5 cm dorsal or skin incision made and deepened sharp dissection bleeders clamped and bovied as required. Tissue was identified and incised longitudinally and reflected from the dorsal medial lateral aspects of the metatarsophalangeal joint. Significant on degeneration of proliferation was noted across both the metatarsal head and the base of the proximal phalanx. The hypertrophied bone was resected utilizing power equipment Roger and rasping. A sagittal saw was used to remove some medial prominence and  dorsal and dorsal medial prominence as well from the first metatarsal head. At this time there was copiously irrigated and a reamer and Cone system were utilized to remove the cartilage from both aspects of the joint. Further subchondral bone was denuded utilizing a bur. Once good subchondral bone was available the area was drilled with a 2.0 drill bit in order to fenestrate and allow for better bone fusion. The toe was placed in a corrected position temporally fixated with a K wire from dorsal to plantar and also a K wire was placed into allow for screw placement. This time a crossing screw from [employer]agon set 3.5 x 26 cannulae screw was placed across the area. Good fixation and position was noted. The position toe was good at this juncture so a plate was placed across the area from the  Onset also. Was a medium metatarsal phalangeal plate with distal screws 2.7 x 13 mm locking screw. Proximal screws included 22.7 x 15 locking screws and 1 nonlocking screw 2.7 x 16 mm. Once this is in place the areas checked FluoroScan excellent position correction and coaptation of the 2 fusion fragments were noted. There is an copiously irrigated and capsule tissue was enclosed with 4 Vicryl continuous stitch as were deep superficial fascial layers and closed with 4 Vicryl in a subcuticular fashion.  This time a curvilinear incision made over the second metatarsophalangeal joint extended to the dorsum of the second toe over the PIP joint. The incision was then deepened sharp blunt dissection bleeders clamped and bovied as required. Extensor tendon over the MTP joint was identified and extensor hood  release was performed and the tendon was contracted laterally. Tissue was then incised and freed mediolaterally. An osteotomy was then performed and second metatarsal at the osteochondral junction from dorsal distal plantar proximal. Metatarsal head was then shifted to a more medial position fixated with 2 K wires from the Paragon set  this 0.2 screws were placed across there including 2.0 x 14 less than another 2.0 x 10 mm headless. There is checked FluoroScan good position correction and metatarsal parabola were noted.  At this time the second toe was addressed and a Beaver blade was used to incise the extensor tendon transversely the PIPJ. The tendon was cleared away from the base of the middle phalanx and the head of the proximal phalanx were cartilage was then resected utilizing a sagittal saw. Once is accomplished a 0.5 K wires drilled through the middle distal phalanges and retrograded in the proximal phalanx while holding toe in good apposition. K wires then traversed across the metatarsophalangeal joint holding the toe in a plantar and lateral position to allow for correction of some of the structural deformity that occurred. There is checked FluoroScan good position correction were noted. There was copiously irrigated extensor tendon over the joint was then sutured with 4 Vicryl simple ruptured sutures 3 sutures were utilized. Proximally the incision was closed Level with 4 Vicryl in Continuous Stitch As Were Deep Superficial Fascial Layers. Skin Was Closed Proximally with 4 Vicryl Subcuticular Fashion. Over the Toes Skin Was Closed with 5-0 Nylon Horizontal Mattress Sutures.  At This Time There Is Black 0.5% Marcaine Plain and Sterile Compressive Dressings Placed across Wound Consisting of Steri-Strips Gauze 4 X 4's Kling and Kerlix. The Tourniquet Been Released Prior to Closure of the Second Toe and Second Metatarsal Wound. Good Bleeding Return of Vascularity Was Noted. A Posterior Splint Was Placed on the Right Foot Leg in the Operating Room.    Patient tolerated the procedure and anesthesia well.  Was transported from the OR to the PACU with all vital signs stable and vascular status intact. To be discharged per routine protocol.  Will follow up in approximately 1 week in the outpatient clinic.

## 2016-10-14 NOTE — Anesthesia Procedure Notes (Signed)
Procedure Name: LMA Insertion Date/Time: 10/14/2016 9:57 AM Performed by: Cameron Ali Pre-anesthesia Checklist: Patient identified, Emergency Drugs available, Suction available, Timeout performed and Patient being monitored Patient Re-evaluated:Patient Re-evaluated prior to inductionOxygen Delivery Method: Circle system utilized Preoxygenation: Pre-oxygenation with 100% oxygen Intubation Type: IV induction LMA: LMA inserted LMA Size: 4.0 Number of attempts: 1 Placement Confirmation: positive ETCO2 and breath sounds checked- equal and bilateral Tube secured with: Tape

## 2016-10-14 NOTE — Anesthesia Preprocedure Evaluation (Signed)
Anesthesia Evaluation  Patient identified by MRN, date of birth, ID band Patient awake    Reviewed: Allergy & Precautions, NPO status , Patient's Chart, lab work & pertinent test results  Airway Mallampati: II  TM Distance: >3 FB Neck ROM: Full    Dental no notable dental hx.    Pulmonary neg pulmonary ROS,    Pulmonary exam normal        Cardiovascular hypertension, Pt. on medications Normal cardiovascular exam     Neuro/Psych negative neurological ROS     GI/Hepatic Neg liver ROS, GERD  ,UC   Endo/Other  negative endocrine ROS  Renal/GU negative Renal ROS     Musculoskeletal  (+) Arthritis , Osteoarthritis,    Abdominal   Peds  Hematology negative hematology ROS (+)   Anesthesia Other Findings   Reproductive/Obstetrics                             Anesthesia Physical Anesthesia Plan  ASA: II  Anesthesia Plan: General and Regional   Post-op Pain Management: GA combined w/ Regional for post-op pain   Induction: Intravenous  Airway Management Planned: LMA  Additional Equipment:   Intra-op Plan:   Post-operative Plan:   Informed Consent: I have reviewed the patients History and Physical, chart, labs and discussed the procedure including the risks, benefits and alternatives for the proposed anesthesia with the patient or authorized representative who has indicated his/her understanding and acceptance.     Plan Discussed with: CRNA  Anesthesia Plan Comments:         Anesthesia Quick Evaluation

## 2016-10-14 NOTE — Transfer of Care (Signed)
Immediate Anesthesia Transfer of Care Note  Patient: Brianna Stevenson  Procedure(s) Performed: Procedure(s) with comments: ARTHRODESIS METATARSALPHALANGEAL JOINT (MTPJ) 1ST (Right) - LMA WITH POPLITEAL METATARSAL OSTEOTOMY RT 2ND (Right) HAMMER TOE CORRECTION 2ND TOE (Right)  Patient Location: PACU  Anesthesia Type: General, Regional  Level of Consciousness: awake, alert  and patient cooperative  Airway and Oxygen Therapy: Patient Spontanous Breathing and Patient connected to supplemental oxygen  Post-op Assessment: Post-op Vital signs reviewed, Patient's Cardiovascular Status Stable, Respiratory Function Stable, Patent Airway and No signs of Nausea or vomiting  Post-op Vital Signs: Reviewed and stable  Complications: No apparent anesthesia complications

## 2016-10-14 NOTE — Anesthesia Postprocedure Evaluation (Signed)
Anesthesia Post Note  Patient: SIRIN TURLINGTON  Procedure(s) Performed: Procedure(s) (LRB): ARTHRODESIS METATARSALPHALANGEAL JOINT (MTPJ) 1ST (Right) METATARSAL OSTEOTOMY RT 2ND (Right) HAMMER TOE CORRECTION 2ND TOE (Right)  Patient location during evaluation: PACU Anesthesia Type: General and Regional Level of consciousness: awake and alert and oriented Pain management: pain level controlled Vital Signs Assessment: post-procedure vital signs reviewed and stable Respiratory status: spontaneous breathing and nonlabored ventilation Cardiovascular status: stable Postop Assessment: no signs of nausea or vomiting and adequate PO intake Anesthetic complications: no    Estill Batten

## 2016-10-14 NOTE — Anesthesia Procedure Notes (Signed)
Anesthesia Regional Block:  Popliteal block  Pre-Anesthetic Checklist: ,, timeout performed, Correct Patient, Correct Site, Correct Laterality, Correct Procedure, Correct Position, site marked, Risks and benefits discussed,  Surgical consent,  Pre-op evaluation,  At surgeon's request and post-op pain management  Laterality: Right  Prep: chloraprep       Needles:  Injection technique: Single-shot  Needle Type: Echogenic Needle     Needle Length: 9cm 9 cm Needle Gauge: 21 and 21 G    Additional Needles:  Procedures: ultrasound guided (picture in chart) Popliteal block Narrative:  Injection made incrementally with aspirations every 5 mL.  Performed by: Personally  Anesthesiologist: Estill Batten  Additional Notes: Functioning IV was confirmed and monitors applied. Ultrasound guidance: relevant anatomy identified, needle position confirmed, local anesthetic spread visualized around nerve(s)., vascular puncture avoided.  Image printed for medical record.  Negative aspiration and no paresthesias; incremental administration of local anesthetic. The patient tolerated the procedure well. Vitals signes recorded in RN notes.

## 2016-10-18 LAB — SURGICAL PATHOLOGY

## 2016-10-19 ENCOUNTER — Encounter: Payer: Self-pay | Admitting: Podiatry

## 2016-11-09 ENCOUNTER — Other Ambulatory Visit: Payer: Self-pay | Admitting: Family Medicine

## 2016-11-09 DIAGNOSIS — Z1231 Encounter for screening mammogram for malignant neoplasm of breast: Secondary | ICD-10-CM

## 2016-11-15 ENCOUNTER — Encounter: Payer: Self-pay | Admitting: Anesthesiology

## 2016-11-15 ENCOUNTER — Ambulatory Visit
Admission: RE | Admit: 2016-11-15 | Discharge: 2016-11-15 | Disposition: A | Discharge status: Home / Self Care | Payer: 59 | Source: Ambulatory Visit | Attending: Gastroenterology | Admitting: Gastroenterology

## 2016-11-15 ENCOUNTER — Encounter
Admission: RE | Disposition: A | Discharge status: Home / Self Care | Payer: Self-pay | Source: Ambulatory Visit | Attending: Gastroenterology

## 2016-11-15 ENCOUNTER — Ambulatory Visit: Payer: 59 | Admitting: Anesthesiology

## 2016-11-15 DIAGNOSIS — K317 Polyp of stomach and duodenum: Secondary | ICD-10-CM | POA: Diagnosis not present

## 2016-11-15 DIAGNOSIS — R1013 Epigastric pain: Secondary | ICD-10-CM | POA: Insufficient documentation

## 2016-11-15 DIAGNOSIS — K219 Gastro-esophageal reflux disease without esophagitis: Secondary | ICD-10-CM | POA: Diagnosis not present

## 2016-11-15 DIAGNOSIS — Z791 Long term (current) use of non-steroidal anti-inflammatories (NSAID): Secondary | ICD-10-CM | POA: Diagnosis not present

## 2016-11-15 DIAGNOSIS — E782 Mixed hyperlipidemia: Secondary | ICD-10-CM | POA: Insufficient documentation

## 2016-11-15 DIAGNOSIS — Z79899 Other long term (current) drug therapy: Secondary | ICD-10-CM | POA: Diagnosis not present

## 2016-11-15 DIAGNOSIS — K208 Other esophagitis: Secondary | ICD-10-CM | POA: Diagnosis not present

## 2016-11-15 DIAGNOSIS — I10 Essential (primary) hypertension: Secondary | ICD-10-CM | POA: Diagnosis not present

## 2016-11-15 DIAGNOSIS — K2960 Other gastritis without bleeding: Secondary | ICD-10-CM | POA: Diagnosis not present

## 2016-11-15 DIAGNOSIS — E559 Vitamin D deficiency, unspecified: Secondary | ICD-10-CM | POA: Diagnosis not present

## 2016-11-15 DIAGNOSIS — K921 Melena: Secondary | ICD-10-CM | POA: Insufficient documentation

## 2016-11-15 DIAGNOSIS — Z7951 Long term (current) use of inhaled steroids: Secondary | ICD-10-CM | POA: Diagnosis not present

## 2016-11-15 HISTORY — DX: Calculus of gallbladder without cholecystitis without obstruction: K80.20

## 2016-11-15 HISTORY — DX: Breast implant status: Z98.82

## 2016-11-15 HISTORY — DX: Polyp of colon: K63.5

## 2016-11-15 HISTORY — PX: ESOPHAGOGASTRODUODENOSCOPY (EGD) WITH PROPOFOL: SHX5813

## 2016-11-15 SURGERY — ESOPHAGOGASTRODUODENOSCOPY (EGD) WITH PROPOFOL
Anesthesia: General

## 2016-11-15 MED ORDER — PROPOFOL 500 MG/50ML IV EMUL
INTRAVENOUS | Status: DC | PRN
  Administered 2016-11-15: 140 ug/kg/min via INTRAVENOUS

## 2016-11-15 MED ORDER — SODIUM CHLORIDE 0.9 % IV SOLN
2.0000 g | INTRAVENOUS | Status: AC
  Administered 2016-11-15: 2 g via INTRAVENOUS

## 2016-11-15 MED ORDER — PHENYLEPHRINE HCL 10 MG/ML IJ SOLN
INTRAMUSCULAR | Status: DC | PRN
  Administered 2016-11-15: 100 ug via INTRAVENOUS

## 2016-11-15 MED ORDER — CEFAZOLIN SODIUM-DEXTROSE 2-4 GM/100ML-% IV SOLN: 2.0000 g | INTRAVENOUS | Status: DC

## 2016-11-15 MED ORDER — FENTANYL CITRATE (PF) 100 MCG/2ML IJ SOLN
INTRAMUSCULAR | Status: DC | PRN
  Administered 2016-11-15: 50 ug via INTRAVENOUS

## 2016-11-15 MED ORDER — MIDAZOLAM HCL 5 MG/5ML IJ SOLN
INTRAMUSCULAR | Status: DC | PRN
  Administered 2016-11-15: 1 mg via INTRAVENOUS

## 2016-11-15 MED ORDER — SODIUM CHLORIDE 0.9 % IV SOLN
2.0000 g | Freq: Once | INTRAVENOUS | Status: AC
  Administered 2016-11-15: 2 g via INTRAVENOUS

## 2016-11-15 MED ORDER — SODIUM CHLORIDE 0.9 % IV SOLN
INTRAVENOUS | Status: DC
  Administered 2016-11-15: 1000 mL via INTRAVENOUS

## 2016-11-15 NOTE — Op Note (Signed)
[facility] Patient Name: Brianna Stevenson Procedure Date: 11/15/2016 3:37 PM MRN: [id number] Account #: [id number] Date of Birth: 1951-05-16 Admit Type: Outpatient Age: 65 Room: [facility] ENDO ROOM 3 Gender: Female Note Status: Finalized Procedure:            Upper GI endoscopy Indications:          Epigastric abdominal pain, Melena Providers:            Lollie Sails, MD Referring MD:         Bo Mcclintock. Vickki Muff (Referring MD) Medicines:            Monitored Anesthesia Care Complications:        No immediate complications. Procedure:            Pre-Anesthesia Assessment:                       - ASA Grade Assessment: II - A patient with mild                        systemic disease.                       After obtaining informed consent, the endoscope was                        passed under direct vision. Throughout the procedure,                        the patient's blood pressure, pulse, and oxygen                        saturations were monitored continuously. The Endoscope                        was introduced through the mouth, and advanced to the                        fourth part of duodenum. The upper GI endoscopy was                        accomplished without difficulty. The patient tolerated                        the procedure well. Findings:      LA Grade C (one or more mucosal breaks continuous between tops of 2 or       more mucosal folds, less than 75% circumference) esophagitis with no       bleeding was found. Biopsies were taken with a cold forceps for       histology.      The exam of the esophagus was otherwise normal.      Patchy mild inflammation characterized by congestion (edema) and       erythema was found in the gastric antrum. Biopsies were taken with a       cold forceps for histology. Biopsies were taken with a cold forceps for       Helicobacter pylori testing.      Multiple 1 to 8 mm pedunculated and sessile polyps with no  bleeding and       no stigmata of recent bleeding were found in the gastric body. Biopsies  were taken with a cold forceps for histology.      The fold at the posterior margin of the duodenal bulb is very irregular       in shape, with a central depression and the appearance of heaped       margins. multiple biopsies were taken.      The exam of the duodenum was otherwise normal. Impression:           - LA Grade C erosive esophagitis. Biopsied.                       - Gastritis. Biopsied.                       - Multiple gastric polyps. Biopsied.                       - abnormal lesion posterior duodenal bulb-biopsied Recommendation:       - Discharge patient to home.                       - Use Protonix (pantoprazole) 40 mg PO BID daily.                       - Use sucralfate tablets 1 gram PO QID daily.                       - Discharge patient to home. Procedure Code(s):    --- Professional ---                       [phone], Esophagogastroduodenoscopy, flexible, transoral;                        with biopsy, single or multiple Diagnosis Code(s):    --- Professional ---                       K20.8, Other esophagitis                       K29.70, Gastritis, unspecified, without bleeding                       K31.7, Polyp of stomach and duodenum                       R10.13, Epigastric pain                       K92.1, Melena (includes Hematochezia) CPT copyright 2016 American Medical Association. All rights reserved. The codes documented in this report are preliminary and upon coder review may  be revised to meet current compliance requirements. Lollie Sails, MD 11/15/2016 4:39:31 PM This report has been signed electronically. Number of Addenda: 0 Note Initiated On: 11/15/2016 3:37 PM      [facility]

## 2016-11-15 NOTE — Transfer of Care (Signed)
Immediate Anesthesia Transfer of Care Note  Patient: Brianna Stevenson  Procedure(s) Performed: Procedure(s): ESOPHAGOGASTRODUODENOSCOPY (EGD) WITH PROPOFOL (N/A)  Patient Location: PACU and Endoscopy Unit  Anesthesia Type:General  Level of Consciousness: awake, alert  and oriented  Airway & Oxygen Therapy: Patient Spontanous Breathing and Patient connected to face mask oxygen  Post-op Assessment: Report given to RN and Post -op Vital signs reviewed and stable  Post vital signs: Reviewed  Last Vitals:  Vitals:   11/15/16 1442  BP: (!) 144/68  Pulse: 93  Resp: 20  Temp: 36.4 C    Last Pain:  Vitals:   11/15/16 1442  TempSrc: Tympanic  PainSc: 5          Complications: No apparent anesthesia complications

## 2016-11-15 NOTE — H&P (Signed)
Outpatient short stay form Pre-procedure 11/15/2016 3:54 PM Lollie Sails MD  Primary Physician: Dr. Kirkland Hun  Reason for visit:  Epigastric pain, melena  History of present illness:  Patient is a 65 year old female presenting as above. She has a history of stage use prior to several months ago. She had a foot surgery done in November. His not taken much in way of NSAID since then and is mostly taking Tylenol. Her symptoms however go back at least 6 months with epigastric pain and some nausea. She does have a history of cholelithiasis however the surgeon that evaluated her stated that he would not do a surgery at this point. But encouraged her to do the scope. She takes no blood thinning agents. She is currently taking a proton pump inhibitor at times twice a day however is not taking it at timing with a meal as best prescribed.   Current Facility-Administered Medications:  .  0.9 %  sodium chloride infusion, , Intravenous, Continuous, Lollie Sails, MD, Last Rate: 20 mL/hr at 11/15/16 1508, 1,000 mL at 11/15/16 1508 .  ceFAZolin (ANCEF) IVPB 2g/100 mL premix, 2 g, Intravenous, STAT, Lollie Sails, MD  Prescriptions Prior to Admission  Medication Sig Dispense Refill Last Dose  . amLODipine-benazepril (LOTREL) 5-20 MG capsule Take 1 capsule by mouth daily.   11/14/2016 at Unknown time  . ampicillin (PRINCIPEN) 500 MG capsule Take 500 mg by mouth daily.    11/14/2016 at Unknown time  . ergocalciferol (VITAMIN D2) 50000 UNITS capsule Take 50,000 Units by mouth once a week.   Past Week at Unknown time  . escitalopram (LEXAPRO) 10 MG tablet Take 10 mg by mouth daily.   Past Week at Unknown time  . esomeprazole (NEXIUM) 40 MG capsule Take 40 mg by mouth 2 (two) times daily before a meal.   11/14/2016 at Unknown time  . fluticasone (FLONASE) 50 MCG/ACT nasal spray Place 2 sprays into both nostrils daily. 16 g 2 Past Week at Unknown time  . folic acid (FOLVITE) 1 MG tablet Take 1 mg  by mouth daily.   11/14/2016 at Unknown time  . hydrochlorothiazide (HYDRODIURIL) 12.5 MG tablet Take 12.5 mg by mouth daily.   11/14/2016 at Unknown time  . meloxicam (MOBIC) 7.5 MG tablet Take 7.5 mg by mouth daily.   Past Week at Unknown time  . metroNIDAZOLE (METROGEL) 1 % gel Apply topically daily.   11/14/2016 at Unknown time  . oxyCODONE-acetaminophen (PERCOCET) 7.5-325 MG tablet Take 1 tablet by mouth every 4 (four) hours as needed for severe pain. 30 tablet 0 Past Week at Unknown time  . rosuvastatin (CRESTOR) 10 MG tablet Take 10 mg by mouth daily.   11/14/2016 at Unknown time  . sulfaSALAzine (AZULFIDINE) 500 MG tablet Take 500 mg by mouth 4 (four) times daily.   11/14/2016 at Unknown time  . doxycycline (VIBRAMYCIN) 100 MG capsule Take 100 mg by mouth 2 (two) times daily.   Completed Course at Unknown time  . omeprazole (PRILOSEC) 40 MG capsule Take 40 mg by mouth daily.   Completed Course at Unknown time  . promethazine (PHENERGAN) 12.5 MG tablet Take 1 tablet (12.5 mg total) by mouth every 6 (six) hours as needed for nausea or vomiting. (Patient not taking: Reported on 11/12/2016) 30 tablet 0 Completed Course at Unknown time     No Known Allergies   Past Medical History:  Diagnosis Date  . Anemia   . Arthritis   . Cholelithiases   .  Colon polyp   . GERD (gastroesophageal reflux disease)   . History of augmentation mammoplasty   . Hypertension   . Mixed hyperlipidemia   . Ulcerative colitis ([place])   . Vitamin D deficiency disease     Review of systems:      Physical Exam    Heart and lungs: Regular rate and rhythm without rub or gallop, lungs are bilaterally clear.    HEENT: Septic atraumatic eyes are anicteric    Other:     Pertinant exam for procedure: Soft nontender nondistended bowel sounds positive normoactive.    Planned proceedures: EGD and indicated procedures I have discussed the risks benefits and complications of procedures to include not limited to  bleeding, infection, perforation and the risk of sedation and the patient wishes to proceed.    Lollie Sails, MD Gastroenterology 11/15/2016  3:54 PM

## 2016-11-15 NOTE — OR Nursing (Signed)
Pt arrived to intra operative room with  Iv tubing wet and fluid leaking on floor. rn returned to preop room and iv fluid on floor. Moderate  Amount  Fluid on floor. rn spoke to dr Gustavo Lah. Unsure whether pt received total dose of ampicillin . md ordered ampicillin 2gm iv again

## 2016-11-15 NOTE — Anesthesia Preprocedure Evaluation (Signed)
Anesthesia Evaluation  Patient identified by MRN, date of birth, ID band Patient awake    Reviewed: Allergy & Precautions, NPO status , Patient's Chart, lab work & pertinent test results  History of Anesthesia Complications Negative for: history of anesthetic complications  Airway Mallampati: I  TM Distance: >3 FB Neck ROM: Full    Dental no notable dental hx. (+) Caps   Pulmonary neg pulmonary ROS,    Pulmonary exam normal        Cardiovascular Exercise Tolerance: Good hypertension, Pt. on medications Normal cardiovascular exam     Neuro/Psych negative neurological ROS     GI/Hepatic Neg liver ROS, PUD, GERD  ,UC   Endo/Other  negative endocrine ROS  Renal/GU negative Renal ROS     Musculoskeletal  (+) Arthritis , Osteoarthritis,    Abdominal   Peds  Hematology negative hematology ROS (+)   Anesthesia Other Findings Past Medical History: No date: Anemia No date: Arthritis No date: Cholelithiases No date: Colon polyp No date: GERD (gastroesophageal reflux disease) No date: History of augmentation mammoplasty No date: Hypertension No date: Mixed hyperlipidemia No date: Ulcerative colitis ([place]) No date: Vitamin D deficiency disease   Reproductive/Obstetrics                            Anesthesia Physical  Anesthesia Plan  ASA: II  Anesthesia Plan: General   Post-op Pain Management:    Induction: Intravenous  Airway Management Planned: LMA  Additional Equipment:   Intra-op Plan:   Post-operative Plan:   Informed Consent: I have reviewed the patients History and Physical, chart, labs and discussed the procedure including the risks, benefits and alternatives for the proposed anesthesia with the patient or authorized representative who has indicated his/her understanding and acceptance.     Plan Discussed with: CRNA, Anesthesiologist and Surgeon  Anesthesia Plan  Comments:        Anesthesia Quick Evaluation

## 2016-11-16 ENCOUNTER — Encounter: Payer: Self-pay | Admitting: Gastroenterology

## 2016-11-16 NOTE — Anesthesia Postprocedure Evaluation (Signed)
Anesthesia Post Note  Patient: Brianna Stevenson  Procedure(s) Performed: Procedure(s) (LRB): ESOPHAGOGASTRODUODENOSCOPY (EGD) WITH PROPOFOL (N/A)  Patient location during evaluation: Endoscopy Anesthesia Type: General Level of consciousness: awake and alert Pain management: pain level controlled Vital Signs Assessment: post-procedure vital signs reviewed and stable Respiratory status: spontaneous breathing, nonlabored ventilation, respiratory function stable and patient connected to nasal cannula oxygen Cardiovascular status: blood pressure returned to baseline and stable Postop Assessment: no signs of nausea or vomiting Anesthetic complications: no    Last Vitals:  Vitals:   11/15/16 1653 11/15/16 1703  BP: 131/86 140/85  Pulse: 66 (!) 58  Resp: 13 (!) 8  Temp:      Last Pain:  Vitals:   11/15/16 1633  TempSrc: Tympanic  PainSc:                  [name]

## 2016-11-17 LAB — SURGICAL PATHOLOGY

## 2016-12-27 ENCOUNTER — Ambulatory Visit
Admission: RE | Admit: 2016-12-27 | Discharge: 2016-12-27 | Disposition: A | Discharge status: Home / Self Care | Payer: 59 | Source: Ambulatory Visit | Attending: Family Medicine | Admitting: Family Medicine

## 2016-12-27 DIAGNOSIS — Z1231 Encounter for screening mammogram for malignant neoplasm of breast: Secondary | ICD-10-CM

## 2017-01-06 ENCOUNTER — Encounter: Payer: Self-pay | Admitting: *Deleted

## 2017-01-10 NOTE — Discharge Instructions (Signed)
[facility] °[facility] ° °POST OPERATIVE INSTRUCTIONS FOR DR. TROXLER AND DR. FOWLER °[facility] ° ° °1. Take your medication as prescribed.  Pain medication should be taken only as needed. ° °2. Keep the dressing clean, dry and intact. ° °3. Keep your foot elevated above the heart level for the first 48 hours. ° °4. Walking to the bathroom and brief periods of walking are acceptable, unless we have instructed you to be non-weight bearing. ° °5. Always wear your post-op shoe when walking.  Always use your crutches if you are to be non-weight bearing. ° °6. Do not take a shower. Baths are permissible as long as the foot is kept out of the water.  ° °7. Every hour you are awake:  °- Bend your knee 15 times. °- Flex foot 15 times °- Massage calf 15 times ° °8. Call [facility] ([phone]) if any of the following problems occur: °- You develop a temperature or fever. °- The bandage becomes saturated with blood. °- Medication does not stop your pain. °- Injury of the foot occurs. °- Any symptoms of infection including redness, odor, or red streaks running from wound. ° ° °General Anesthesia, Adult, Care After °These instructions provide you with information about caring for yourself after your procedure. Your health care provider may also give you more specific instructions. Your treatment has been planned according to current medical practices, but problems sometimes occur. Call your health care provider if you have any problems or questions after your procedure. °What can I expect after the procedure? °After the procedure, it is common to have: °· Vomiting. °· A sore throat. °· Mental slowness. °It is common to feel: °· Nauseous. °· Cold or shivery. °· Sleepy. °· Tired. °· Sore or achy, even in parts of your body where you did not have surgery. °Follow these instructions at home: °For at least 24 hours after the procedure: °· Do not: °¨ Participate in  activities where you could fall or become injured. °¨ Drive. °¨ Use heavy machinery. °¨ Drink alcohol. °¨ Take sleeping pills or medicines that cause drowsiness. °¨ Make important decisions or sign legal documents. °¨ Take care of children on your own. °· Rest. °Eating and drinking °· If you vomit, drink water, juice, or soup when you can drink without vomiting. °· Drink enough fluid to keep your urine clear or pale yellow. °· Make sure you have little or no nausea before eating solid foods. °· Follow the diet recommended by your health care provider. °General instructions °· Have a responsible adult stay with you until you are awake and alert. °· Return to your normal activities as told by your health care provider. Ask your health care provider what activities are safe for you. °· Take over-the-counter and prescription medicines only as told by your health care provider. °· If you smoke, do not smoke without supervision. °· Keep all follow-up visits as told by your health care provider. This is important. °Contact a health care provider if: °· You continue to have nausea or vomiting at home, and medicines are not helpful. °· You cannot drink fluids or start eating again. °· You cannot urinate after 8-12 hours. °· You develop a skin rash. °· You have fever. °· You have increasing redness at the site of your procedure. °Get help right away if: °· You have difficulty breathing. °· You have chest pain. °· You have unexpected bleeding. °· You feel that you are having   a life-threatening or urgent problem. °This information is not intended to replace advice given to you by your health care provider. Make sure you discuss any questions you have with your health care provider. °Document Released: 02/28/2001 Document Revised: 04/26/2016 Document Reviewed: 11/06/2015 °Elsevier Interactive Patient Education © 2017 [employer] ° °

## 2017-01-13 ENCOUNTER — Encounter
Admission: RE | Disposition: A | Discharge status: Home / Self Care | Payer: Self-pay | Source: Ambulatory Visit | Attending: Podiatry

## 2017-01-13 ENCOUNTER — Ambulatory Visit
Admission: RE | Admit: 2017-01-13 | Discharge: 2017-01-13 | Disposition: A | Discharge status: Home / Self Care | Payer: 59 | Source: Ambulatory Visit | Attending: Podiatry | Admitting: Podiatry

## 2017-01-13 ENCOUNTER — Ambulatory Visit: Payer: 59 | Admitting: Anesthesiology

## 2017-01-13 DIAGNOSIS — K219 Gastro-esophageal reflux disease without esophagitis: Secondary | ICD-10-CM | POA: Insufficient documentation

## 2017-01-13 DIAGNOSIS — I10 Essential (primary) hypertension: Secondary | ICD-10-CM | POA: Diagnosis not present

## 2017-01-13 DIAGNOSIS — M2022 Hallux rigidus, left foot: Secondary | ICD-10-CM | POA: Diagnosis not present

## 2017-01-13 HISTORY — PX: ARTHRODESIS METATARSALPHALANGEAL JOINT (MTPJ): SHX6566

## 2017-01-13 SURGERY — FUSION, JOINT, GREAT TOE
Anesthesia: General | Site: Foot | Laterality: Left | Wound class: Clean

## 2017-01-13 MED ORDER — MIDAZOLAM HCL 5 MG/5ML IJ SOLN
INTRAMUSCULAR | Status: DC | PRN
  Administered 2017-01-13: 2 mg via INTRAVENOUS

## 2017-01-13 MED ORDER — OXYCODONE HCL 5 MG PO TABS: 5.0000 mg | ORAL_TABLET | Freq: Once | ORAL | Status: DC | PRN

## 2017-01-13 MED ORDER — OXYCODONE HCL 5 MG/5ML PO SOLN: 5.0000 mg | Freq: Once | ORAL | Status: DC | PRN

## 2017-01-13 MED ORDER — EPHEDRINE SULFATE 50 MG/ML IJ SOLN
INTRAMUSCULAR | Status: DC | PRN
  Administered 2017-01-13: 10 mg via INTRAVENOUS
  Administered 2017-01-13 (×5): 5 mg via INTRAVENOUS

## 2017-01-13 MED ORDER — BUPIVACAINE HCL 0.25 % IJ SOLN
INTRAMUSCULAR | Status: DC | PRN
  Administered 2017-01-13: 4 mL

## 2017-01-13 MED ORDER — HYDROMORPHONE HCL 1 MG/ML IJ SOLN: 0.2500 mg | INTRAMUSCULAR | Status: DC | PRN

## 2017-01-13 MED ORDER — ONDANSETRON HCL 4 MG/2ML IJ SOLN
INTRAMUSCULAR | Status: DC | PRN
  Administered 2017-01-13: 4 mg via INTRAVENOUS

## 2017-01-13 MED ORDER — BUPIVACAINE LIPOSOME 1.3 % IJ SUSP
INTRAMUSCULAR | Status: DC | PRN
  Administered 2017-01-13: 6 mL

## 2017-01-13 MED ORDER — LACTATED RINGERS IV SOLN
INTRAVENOUS | Status: DC
  Administered 2017-01-13 (×2): via INTRAVENOUS

## 2017-01-13 MED ORDER — ONDANSETRON HCL 4 MG/2ML IJ SOLN: 4.0000 mg | Freq: Once | INTRAMUSCULAR | Status: DC | PRN

## 2017-01-13 MED ORDER — OXYCODONE-ACETAMINOPHEN 7.5-325 MG PO TABS: 1.0000 | ORAL_TABLET | ORAL | 0 refills | Status: AC | PRN

## 2017-01-13 MED ORDER — DEXAMETHASONE SODIUM PHOSPHATE 4 MG/ML IJ SOLN
INTRAMUSCULAR | Status: DC | PRN
  Administered 2017-01-13: 4 mg via INTRAVENOUS

## 2017-01-13 MED ORDER — ACETAMINOPHEN 160 MG/5ML PO SOLN: 325.0000 mg | ORAL | Status: DC | PRN

## 2017-01-13 MED ORDER — GLYCOPYRROLATE 0.2 MG/ML IJ SOLN
INTRAMUSCULAR | Status: DC | PRN
  Administered 2017-01-13: 0.1 mg via INTRAVENOUS

## 2017-01-13 MED ORDER — ACETAMINOPHEN 325 MG PO TABS: 325.0000 mg | ORAL_TABLET | ORAL | Status: DC | PRN

## 2017-01-13 MED ORDER — LIDOCAINE HCL (PF) 1 % IJ SOLN
INTRAMUSCULAR | Status: DC | PRN
  Administered 2017-01-13: 4 mL

## 2017-01-13 MED ORDER — LIDOCAINE HCL (CARDIAC) 20 MG/ML IV SOLN
INTRAVENOUS | Status: DC | PRN
  Administered 2017-01-13: 50 mg via INTRATRACHEAL

## 2017-01-13 MED ORDER — CEFAZOLIN SODIUM-DEXTROSE 2-4 GM/100ML-% IV SOLN
2.0000 g | Freq: Once | INTRAVENOUS | Status: AC
  Administered 2017-01-13: 2 g via INTRAVENOUS

## 2017-01-13 MED ORDER — PROPOFOL 10 MG/ML IV BOLUS
INTRAVENOUS | Status: DC | PRN
  Administered 2017-01-13: 150 mg via INTRAVENOUS

## 2017-01-13 MED ORDER — FENTANYL CITRATE (PF) 100 MCG/2ML IJ SOLN
INTRAMUSCULAR | Status: DC | PRN
  Administered 2017-01-13: 25 ug via INTRAVENOUS

## 2017-01-13 SURGICAL SUPPLY — 90 items
APL SKNCLS STERI-STRIP NONHPOA (GAUZE/BANDAGES/DRESSINGS) ×1
BANDAGE ELASTIC 4 LF NS (GAUZE/BANDAGES/DRESSINGS) ×2 IMPLANT
BENZOIN TINCTURE PRP APPL 2/3 (GAUZE/BANDAGES/DRESSINGS) ×2 IMPLANT
BIT DRILL 2 FENESTRATED (MISCELLANEOUS) IMPLANT
BIT DRILL CANNULTD 2.6 X 130MM (MISCELLANEOUS) IMPLANT
BIT DRILL SOLID 2.0 X 110MM (DRILL) IMPLANT
BIT DRILLL 2 FENESTRATED (MISCELLANEOUS) ×1
BLADE CRESCENTIC (BLADE) IMPLANT
BLADE MED AGGRESSIVE (BLADE) IMPLANT
BLADE MINI RND TIP GREEN BEAV (BLADE) IMPLANT
BLADE OSC/SAGITTAL 5.5X25 (BLADE) IMPLANT
BLADE OSC/SAGITTAL MD 5.5X18 (BLADE) IMPLANT
BLADE OSC/SAGITTAL MD 9X18.5 (BLADE) ×1 IMPLANT
BLADE OSCILLATING/SAGITTAL (BLADE)
BLADE SW THK.38XMED LNG THN (BLADE) IMPLANT
BNDG CMPR 75X41 PLY HI ABS (GAUZE/BANDAGES/DRESSINGS) ×1
BNDG CMPR MED 5X4 ELC HKLP NS (GAUZE/BANDAGES/DRESSINGS) ×1
BNDG ESMARK 4X12 TAN STRL LF (GAUZE/BANDAGES/DRESSINGS) ×2 IMPLANT
BNDG GAUZE 4.5X4.1 6PLY STRL (MISCELLANEOUS) ×2 IMPLANT
BNDG STRETCH 4X75 STRL LF (GAUZE/BANDAGES/DRESSINGS) ×2 IMPLANT
BUR EGG 4X8 MED (BURR) IMPLANT
BUR STRYKR EGG 5.0 (BURR) IMPLANT
BUR SURG 4X8 MED (BURR) IMPLANT
BURR SURG 4X8 MED (BURR) ×2
CANISTER SUCT 1200ML W/VALVE (MISCELLANEOUS) ×2 IMPLANT
CAST PADDING 3X4FT ST 30246 (SOFTGOODS) ×1
CONE REAMER 21 (MISCELLANEOUS) ×1 IMPLANT
COUNTERSICK 4.0 HEADED (MISCELLANEOUS) ×2
COVER LIGHT HANDLE UNIVERSAL (MISCELLANEOUS) ×4 IMPLANT
COVER PIN YLW 0.028-062 (MISCELLANEOUS) IMPLANT
CUFF TOURN SGL QUICK 18 (TOURNIQUET CUFF) ×1 IMPLANT
CUP REAMER 21 (MISCELLANEOUS) ×1 IMPLANT
DRAPE FLUOR MINI C-ARM 54X84 (DRAPES) ×2 IMPLANT
DRILL CANNULATED 2.6 X 130MM (MISCELLANEOUS) ×2
DRILL SOLID 2.0 X 110MM (DRILL) ×2
DRILL WIRE PASS (DRILL) IMPLANT
DURAPREP 26ML APPLICATOR (WOUND CARE) ×2 IMPLANT
GAUZE PETRO XEROFOAM 1X8 (MISCELLANEOUS) ×2 IMPLANT
GAUZE SPONGE 4X4 12PLY STRL (GAUZE/BANDAGES/DRESSINGS) ×2 IMPLANT
GLOVE BIO SURGEON STRL SZ8 (GLOVE) ×2 IMPLANT
GOWN STRL REUS W/ TWL LRG LVL3 (GOWN DISPOSABLE) ×1 IMPLANT
GOWN STRL REUS W/ TWL XL LVL3 (GOWN DISPOSABLE) ×1 IMPLANT
GOWN STRL REUS W/TWL LRG LVL3 (GOWN DISPOSABLE) ×2
GOWN STRL REUS W/TWL XL LVL3 (GOWN DISPOSABLE) ×2
K-WIRE DBL END TROCAR 6X.045 (WIRE)
K-WIRE DBL END TROCAR 6X.062 (WIRE)
K-WIRE SMOOTH 1.6X150MM (WIRE) ×4
K-WIRE SNGL END 1.2X150 (MISCELLANEOUS) ×8
KIT ROOM TURNOVER OR (KITS) ×2 IMPLANT
KWIRE DBL END TROCAR 6X.045 (WIRE) IMPLANT
KWIRE DBL END TROCAR 6X.062 (WIRE) IMPLANT
KWIRE SMOOTH 1.6X150MM (WIRE) IMPLANT
KWIRE SNGL END 1.2X150 (MISCELLANEOUS) IMPLANT
NDL HYPO 18GX1.5 BLUNT FILL (NEEDLE) IMPLANT
NDL HYPO 25GX1X1/2 BEV (NEEDLE) IMPLANT
NEEDLE HYPO 18GX1.5 BLUNT FILL (NEEDLE) IMPLANT
NEEDLE HYPO 25GX1X1/2 BEV (NEEDLE) ×2 IMPLANT
NS IRRIG 500ML POUR BTL (IV SOLUTION) ×2 IMPLANT
PACK EXTREMITY ARMC (MISCELLANEOUS) ×2 IMPLANT
PAD CAST CTTN 3X4 STRL (SOFTGOODS) IMPLANT
PAD GROUND ADULT SPLIT (MISCELLANEOUS) ×2 IMPLANT
PADDING CAST COTTON 3X4 STRL (SOFTGOODS) ×1
PLATE MTP MED L AERO DEGREE (Plate) ×1 IMPLANT
RASP SM TEAR CROSS CUT (RASP) ×1 IMPLANT
SCREW 4.0X28 STRL (Screw) ×1 IMPLANT
SCREW COUNTERSINK 4.0 HEADED (MISCELLANEOUS) ×1 IMPLANT
SCREW LOCK PLATE R3 2.7X12 (Screw) ×1 IMPLANT
SCREW LOCK PLATE R3 2.7X14 (Screw) ×3 IMPLANT
SCREW LOCK PLATE R3 2.7X16 ×1 IMPLANT
SCREW NON LOCKING 3.5X12 (Screw) ×1 IMPLANT
SPLINT CAST 1 STEP 4X30 (MISCELLANEOUS) ×2 IMPLANT
SPLINT FAST PLASTER 5X30 (CAST SUPPLIES) ×1
SPLINT PLASTER CAST FAST 5X30 (CAST SUPPLIES) IMPLANT
STOCKINETTE STRL 6IN 960660 (GAUZE/BANDAGES/DRESSINGS) ×2 IMPLANT
STRAP BODY AND KNEE 60X3 (MISCELLANEOUS) ×2 IMPLANT
STRIP CLOSURE SKIN 1/4X4 (GAUZE/BANDAGES/DRESSINGS) ×2 IMPLANT
SUT ETHILON 4-0 (SUTURE)
SUT ETHILON 4-0 FS2 18XMFL BLK (SUTURE)
SUT ETHILON 5-0 FS-2 18 BLK (SUTURE) IMPLANT
SUT VIC AB 1 CT1 36 (SUTURE) IMPLANT
SUT VIC AB 2-0 CT1 27 (SUTURE)
SUT VIC AB 2-0 CT1 TAPERPNT 27 (SUTURE) IMPLANT
SUT VIC AB 2-0 SH 27 (SUTURE)
SUT VIC AB 2-0 SH 27XBRD (SUTURE) IMPLANT
SUT VIC AB 3-0 SH 27 (SUTURE)
SUT VIC AB 3-0 SH 27X BRD (SUTURE) IMPLANT
SUT VIC AB 4-0 FS2 27 (SUTURE) ×1 IMPLANT
SUT VICRYL AB 3-0 FS1 BRD 27IN (SUTURE) IMPLANT
SUTURE ETHLN 4-0 FS2 18XMF BLK (SUTURE) IMPLANT
SYRINGE 10CC LL (SYRINGE) IMPLANT

## 2017-01-13 NOTE — Op Note (Signed)
Operative note   Surgeon: Dr. Albertine Patricia, DPM.    Assistant: None    Preop diagnosis: Hallux rigidus left foot    Postop diagnosis: Same    Procedure:   1. Arthrodesis first metatarsophalangeal joint left foot           EBL: 5 cc    Anesthesia:IV sedation deliverable anesthesia team local anesthesia delivered by me including 50/50 mixture of 0.25% Marcaine and 1% lidocaine plain. At the end of the case the patient was blocked with 6 cc of Exparell for long-lasting anesthetic locally.    Hemostasis: Ankle tourniquet 250 mils work pressure for 50 minutes    Specimen: None    Complications: None    Operative indications: Chronic pain unresponsive to conservative care    Procedure:  Patient was brought into the OR and placed on the operating table in thesupine position. After anesthesia was obtained theleft lower extremity was prepped and draped in usual sterile fashion.  Operative Report: This time to his directed the first MTP joint of the left foot. A 5 cemented dorsolinear skin incision was made and deepened sharp blunt dissection. Bleeders clamped and bovied as required. The capsular periosteal tissue overlying the joint was identified and incised longitudinally reflected mediolaterally. Noted bone proliferation and degenerative changes were noted to the first metatarsal head and the excess bone proliferation spurring was then removed with power equipment Rogers. Same is true for the base of the proximal phalanx. The metatarsal head was inspected and seen to be significantly eroded with at least 75% of articular cartilage eroded and denuded from the metatarsal head. Proximal phalanx base also had significant loss of articular cartilage. This point a cup and cone reamer set was used to remove the cartilage from both sides of the joint. A bur was used to further remove cartilage and achieve exposure of good subchondral bone. Once this was accomplished the joint on both sides was  fenestrated with a 20 drill bit. Once this was completed a fixation was achieved utilizing a Paragon plate system with a 40 compression screw. Once this was in place the area was seen to be stable with good alignment. This was checked under FluoroScan. 3 screws on the distal plate and 2 screws on the proximal aspect of the plate were accomplished. The crossing compression screw was stable in good alignment as well. After copious irrigation the capsule tissue was enclosed with 4 Vicryl in continuous stitch as were deep superficial fascial layers. Skin closed with 4 Vicryl in a subcuticular fashion. The proximal aspect of the foot was then blocked with the Exparell and a sterile compressive dressings placed across wound consisting of Steri-Strips Xeroform gauze 4 x 4's Kling and Kerlix. A posterior splint was placed on the left foot leg in the operating room. The patient is to remain nonweightbearing.    Patient tolerated the procedure and anesthesia well.  Was transported from the OR to the PACU with all vital signs stable and vascular status intact. To be discharged per routine protocol.  Will follow up in approximately 1 week in the outpatient clinic.

## 2017-01-13 NOTE — H&P (Signed)
H and P has been reviewed and no changes are noted.  

## 2017-01-13 NOTE — Transfer of Care (Signed)
Immediate Anesthesia Transfer of Care Note  Patient: Brianna Stevenson  Procedure(s) Performed: Procedure(s) with comments: ARTHRODESIS METATARSALPHALANGEAL JOINT (MTPJ) (Left) - LMA w/ Local  Patient Location: PACU  Anesthesia Type: General  Level of Consciousness: awake, alert  and patient cooperative  Airway and Oxygen Therapy: Patient Spontanous Breathing and Patient connected to supplemental oxygen  Post-op Assessment: Post-op Vital signs reviewed, Patient's Cardiovascular Status Stable, Respiratory Function Stable, Patent Airway and No signs of Nausea or vomiting  Post-op Vital Signs: Reviewed and stable  Complications: No apparent anesthesia complications

## 2017-01-13 NOTE — Anesthesia Procedure Notes (Signed)
Procedure Name: LMA Insertion Date/Time: 01/13/2017 12:45 PM Performed by: Cameron Ali Pre-anesthesia Checklist: Patient identified, Emergency Drugs available, Suction available, Timeout performed and Patient being monitored Patient Re-evaluated:Patient Re-evaluated prior to inductionOxygen Delivery Method: Circle system utilized Preoxygenation: Pre-oxygenation with 100% oxygen Intubation Type: IV induction LMA: LMA inserted LMA Size: 3.0 Number of attempts: 1 Placement Confirmation: positive ETCO2 and breath sounds checked- equal and bilateral Tube secured with: Tape

## 2017-01-13 NOTE — Anesthesia Preprocedure Evaluation (Signed)
Anesthesia Evaluation  Patient identified by MRN, date of birth, ID band Patient awake    Reviewed: Allergy & Precautions, H&P , NPO status , Patient's Chart, lab work & pertinent test results, reviewed documented beta blocker date and time   Airway Mallampati: III  TM Distance: >3 FB Neck ROM: full    Dental no notable dental hx.    Pulmonary neg pulmonary ROS,    Pulmonary exam normal breath sounds clear to auscultation       Cardiovascular Exercise Tolerance: Good hypertension, Normal cardiovascular exam Rhythm:regular Rate:Normal     Neuro/Psych negative neurological ROS  negative psych ROS   GI/Hepatic Neg liver ROS, GERD  Medicated and Controlled,  Endo/Other  negative endocrine ROS  Renal/GU negative Renal ROS  negative genitourinary   Musculoskeletal   Abdominal   Peds  Hematology negative hematology ROS (+)   Anesthesia Other Findings   Reproductive/Obstetrics negative OB ROS                             Anesthesia Physical Anesthesia Plan  ASA: II  Anesthesia Plan: General   Post-op Pain Management:    Induction:   Airway Management Planned:   Additional Equipment:   Intra-op Plan:   Post-operative Plan:   Informed Consent: I have reviewed the patients History and Physical, chart, labs and discussed the procedure including the risks, benefits and alternatives for the proposed anesthesia with the patient or authorized representative who has indicated his/her understanding and acceptance.   Dental Advisory Given  Plan Discussed with: CRNA  Anesthesia Plan Comments:         Anesthesia Quick Evaluation

## 2017-01-13 NOTE — Anesthesia Postprocedure Evaluation (Signed)
Anesthesia Post Note  Patient: Brianna Stevenson  Procedure(s) Performed: Procedure(s) (LRB): ARTHRODESIS METATARSALPHALANGEAL JOINT (MTPJ) (Left)  Patient location during evaluation: PACU Anesthesia Type: General Level of consciousness: awake and alert Pain management: pain level controlled Vital Signs Assessment: post-procedure vital signs reviewed and stable Respiratory status: spontaneous breathing, nonlabored ventilation, respiratory function stable and patient connected to nasal cannula oxygen Cardiovascular status: blood pressure returned to baseline and stable Postop Assessment: no signs of nausea or vomiting Anesthetic complications: no    Trecia Rogers

## 2017-01-14 ENCOUNTER — Encounter: Payer: Self-pay | Admitting: Podiatry

## 2017-04-11 ENCOUNTER — Encounter: Payer: Self-pay | Admitting: *Deleted

## 2017-04-12 ENCOUNTER — Ambulatory Visit
Admission: RE | Admit: 2017-04-12 | Discharge: 2017-04-12 | Disposition: A | Discharge status: Home / Self Care | Payer: 59 | Source: Ambulatory Visit | Attending: Gastroenterology | Admitting: Gastroenterology

## 2017-04-12 ENCOUNTER — Ambulatory Visit: Payer: 59 | Admitting: Anesthesiology

## 2017-04-12 ENCOUNTER — Encounter
Admission: RE | Disposition: A | Discharge status: Home / Self Care | Payer: Self-pay | Source: Ambulatory Visit | Attending: Gastroenterology

## 2017-04-12 ENCOUNTER — Encounter: Payer: Self-pay | Admitting: *Deleted

## 2017-04-12 DIAGNOSIS — I10 Essential (primary) hypertension: Secondary | ICD-10-CM | POA: Insufficient documentation

## 2017-04-12 DIAGNOSIS — Z79899 Other long term (current) drug therapy: Secondary | ICD-10-CM | POA: Diagnosis not present

## 2017-04-12 DIAGNOSIS — Z86010 Personal history of colonic polyps: Secondary | ICD-10-CM | POA: Insufficient documentation

## 2017-04-12 DIAGNOSIS — Z1211 Encounter for screening for malignant neoplasm of colon: Secondary | ICD-10-CM | POA: Insufficient documentation

## 2017-04-12 DIAGNOSIS — K635 Polyp of colon: Secondary | ICD-10-CM | POA: Diagnosis not present

## 2017-04-12 DIAGNOSIS — D649 Anemia, unspecified: Secondary | ICD-10-CM | POA: Diagnosis not present

## 2017-04-12 DIAGNOSIS — E559 Vitamin D deficiency, unspecified: Secondary | ICD-10-CM | POA: Insufficient documentation

## 2017-04-12 DIAGNOSIS — K5190 Ulcerative colitis, unspecified, without complications: Secondary | ICD-10-CM | POA: Diagnosis not present

## 2017-04-12 DIAGNOSIS — K640 First degree hemorrhoids: Secondary | ICD-10-CM | POA: Diagnosis not present

## 2017-04-12 DIAGNOSIS — K219 Gastro-esophageal reflux disease without esophagitis: Secondary | ICD-10-CM | POA: Diagnosis not present

## 2017-04-12 DIAGNOSIS — M1990 Unspecified osteoarthritis, unspecified site: Secondary | ICD-10-CM | POA: Insufficient documentation

## 2017-04-12 DIAGNOSIS — E782 Mixed hyperlipidemia: Secondary | ICD-10-CM | POA: Insufficient documentation

## 2017-04-12 HISTORY — PX: COLONOSCOPY WITH PROPOFOL: SHX5780

## 2017-04-12 SURGERY — COLONOSCOPY WITH PROPOFOL
Anesthesia: General

## 2017-04-12 MED ORDER — PHENYLEPHRINE HCL 10 MG/ML IJ SOLN
INTRAMUSCULAR | Status: DC | PRN
  Administered 2017-04-12 (×3): 100 ug via INTRAVENOUS

## 2017-04-12 MED ORDER — SODIUM CHLORIDE 0.9 % IV SOLN
INTRAVENOUS | Status: DC
  Administered 2017-04-12: 08:00:00 via INTRAVENOUS

## 2017-04-12 MED ORDER — MIDAZOLAM HCL 2 MG/2ML IJ SOLN
INTRAMUSCULAR | Status: AC
  Filled 2017-04-12: qty 2

## 2017-04-12 MED ORDER — PROPOFOL 500 MG/50ML IV EMUL
INTRAVENOUS | Status: DC | PRN
  Administered 2017-04-12: 140 ug/kg/min via INTRAVENOUS

## 2017-04-12 MED ORDER — PROPOFOL 10 MG/ML IV BOLUS
INTRAVENOUS | Status: AC
  Filled 2017-04-12: qty 20

## 2017-04-12 MED ORDER — PROPOFOL 10 MG/ML IV BOLUS
INTRAVENOUS | Status: DC | PRN
  Administered 2017-04-12: 100 mg via INTRAVENOUS

## 2017-04-12 MED ORDER — FENTANYL CITRATE (PF) 100 MCG/2ML IJ SOLN
INTRAMUSCULAR | Status: DC | PRN
  Administered 2017-04-12: 50 ug via INTRAVENOUS

## 2017-04-12 MED ORDER — MIDAZOLAM HCL 5 MG/5ML IJ SOLN
INTRAMUSCULAR | Status: DC | PRN
  Administered 2017-04-12: 1 mg via INTRAVENOUS

## 2017-04-12 MED ORDER — PROPOFOL 500 MG/50ML IV EMUL
INTRAVENOUS | Status: AC
  Filled 2017-04-12: qty 50

## 2017-04-12 MED ORDER — SODIUM CHLORIDE 0.9 % IV SOLN
INTRAVENOUS | Status: DC
  Administered 2017-04-12: 07:00:00 via INTRAVENOUS

## 2017-04-12 MED ORDER — LIDOCAINE 2% (20 MG/ML) 5 ML SYRINGE
INTRAMUSCULAR | Status: DC | PRN
  Administered 2017-04-12: 40 mg via INTRAVENOUS

## 2017-04-12 MED ORDER — FENTANYL CITRATE (PF) 100 MCG/2ML IJ SOLN
INTRAMUSCULAR | Status: AC
  Filled 2017-04-12: qty 2

## 2017-04-12 NOTE — H&P (Signed)
Outpatient short stay form Pre-procedure 04/12/2017 7:36 AM Lollie Sails MD  Primary Physician: Dr. Kirkland Hun  Reason for visit:  Colonoscopy  History of present illness:  Patient is a 66 year old female with personal history of ulcerative colitis. She has been doing well taking is already being. She is presenting today for a surveillance colonoscopy. 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 She tolerated her prep well. She takes no aspirin or blood thinning agents.    Current Facility-Administered Medications:  .  0.9 %  sodium chloride infusion, , Intravenous, Continuous, Lollie Sails, MD, Last Rate: 20 mL/hr at 04/12/17 0724 .  0.9 %  sodium chloride infusion, , Intravenous, Continuous, Lollie Sails, MD  Prescriptions Prior to Admission  Medication Sig Dispense Refill Last Dose  . amLODipine-benazepril (LOTREL) 5-20 MG capsule Take 1 capsule by mouth daily.   04/11/2017 at Unknown time  . ampicillin (PRINCIPEN) 500 MG capsule Take 500 mg by mouth daily.    04/11/2017 at Unknown time  . escitalopram (LEXAPRO) 10 MG tablet Take 10 mg by mouth daily.   Past Month at Unknown time  . esomeprazole (NEXIUM) 40 MG capsule Take 40 mg by mouth 2 (two) times daily before a meal.   04/11/2017 at Unknown time  . fluticasone (FLONASE) 50 MCG/ACT nasal spray Place 2 sprays into both nostrils daily. 16 g 2 Past Month at Unknown time  . folic acid (FOLVITE) 1 MG tablet Take 1 mg by mouth daily.   04/11/2017 at Unknown time  . hydrochlorothiazide (HYDRODIURIL) 12.5 MG tablet Take 12.5 mg by mouth daily.   04/11/2017 at Unknown time  . metroNIDAZOLE (METROGEL) 1 % gel Apply topically daily.   Past Week at Unknown time  . rosuvastatin (CRESTOR) 10 MG tablet Take 10 mg by mouth daily.   04/11/2017 at Unknown time  . sucralfate (CARAFATE) 1 g tablet Take 1 g by mouth 4 (four) times daily -  with meals and at bedtime.   04/11/2017 at Unknown time  . sulfaSALAzine (AZULFIDINE) 500 MG tablet Take 500  mg by mouth 4 (four) times daily.   04/11/2017 at Unknown time  . oxyCODONE-acetaminophen (PERCOCET) 7.5-325 MG tablet Take 1 tablet by mouth every 4 (four) hours as needed for severe pain. (Patient not taking: Reported on 04/12/2017) 30 tablet 0 Not Taking at Unknown time  . pantoprazole (PROTONIX) 40 MG tablet Take 40 mg by mouth daily.   Completed Course at Unknown time     No Known Allergies   Past Medical History:  Diagnosis Date  . Anemia   . Arthritis   . Cholelithiases   . Colon polyp   . GERD (gastroesophageal reflux disease)   . History of augmentation mammoplasty   . Hypertension   . Mixed hyperlipidemia   . Ulcerative colitis ([place])   . Vitamin D deficiency disease     Review of systems:      Physical Exam    Heart and lungs: Regular rate and rhythm without rub or gallop, lungs are bilaterally clear.    HEENT: Normocephalic atraumatic eyes are anicteric    Other:     Pertinant exam for procedure: Soft nontender nondistended bowel sounds positive normoactive.    Planned proceedures: Colonoscopy and indicated procedures. I have discussed the risks benefits and complications of procedures to include not limited to bleeding, infection, perforation and the risk of sedation and the patient wishes to proceed.    Lollie Sails, MD Gastroenterology 04/12/2017  7:36 AM

## 2017-04-12 NOTE — Anesthesia Post-op Follow-up Note (Cosign Needed)
Anesthesia QCDR form completed.        

## 2017-04-12 NOTE — Anesthesia Postprocedure Evaluation (Signed)
Anesthesia Post Note  Patient: Brianna Stevenson  Procedure(s) Performed: Procedure(s) (LRB): COLONOSCOPY WITH PROPOFOL (N/A)  Patient location during evaluation: Endoscopy Anesthesia Type: General Level of consciousness: awake and alert Pain management: pain level controlled Vital Signs Assessment: post-procedure vital signs reviewed and stable Respiratory status: spontaneous breathing, nonlabored ventilation, respiratory function stable and patient connected to nasal cannula oxygen Cardiovascular status: blood pressure returned to baseline and stable Postop Assessment: no signs of nausea or vomiting Anesthetic complications: no     Last Vitals:  Vitals:   04/12/17 0900 04/12/17 0910  BP: 103/71 106/65  Pulse: (!) 58   Resp: 12   Temp:      Last Pain:  Vitals:   04/12/17 0833  TempSrc: Tympanic                 Yumiko Alkins S

## 2017-04-12 NOTE — Op Note (Signed)
[facility] Gastroenterology Patient Name: Brianna Stevenson Procedure Date: 04/12/2017 7:37 AM MRN: [id number] Account #: [id number] Date of Birth: 24-Dec-1950 Admit Type: Outpatient Age: 66 Room: [facility] ENDO ROOM 3 Gender: Female Note Status: Finalized Procedure:            Colonoscopy Indications:          Personal history of ulcerative colitis, Ulcerative                        colitis Providers:            Lollie Sails, MD Referring MD:         Bo Mcclintock. Vickki Muff (Referring MD) Medicines:            Monitored Anesthesia Care Complications:        No immediate complications. Procedure:            Pre-Anesthesia Assessment:                       - ASA Grade Assessment: III - A patient with severe                        systemic disease.                       After obtaining informed consent, the colonoscope was                        passed under direct vision. Throughout the procedure,                        the patient's blood pressure, pulse, and oxygen                        saturations were monitored continuously. The                        Colonoscope was introduced through the anus and                        advanced to the the cecum, identified by appendiceal                        orifice and ileocecal valve. The patient tolerated the                        procedure well. The quality of the bowel preparation                        was good. Findings:      colon is somewhat featureless with loss of haustral folds, appearance of       minimal diffuse inflamation.      Two sessile polyps were found in the proximal transverse colon. The       polyps were 2 to 3 mm in size. These polyps were removed with a cold       biopsy forceps. Resection and retrieval were complete.      Biopsies for histology were taken with a cold forceps from the cecum,       ascending colon, transverse colon, proximal descending colon, distal       descending colon, sigmoid  colon and  rectum for evaluation of microscopic       colitis.      The exam was otherwise without abnormality.      The digital rectal exam was normal.      Non-bleeding internal hemorrhoids were found during anoscopy. The       hemorrhoids were small and Grade I (internal hemorrhoids that do not       prolapse). Impression:           - Two 2 to 3 mm polyps in the proximal transverse                        colon, removed with a cold biopsy forceps. Resected and                        retrieved.                       - The examination was otherwise normal.                       - Non-bleeding internal hemorrhoids.                       - Biopsies were taken with a cold forceps from the                        cecum, ascending colon, transverse colon, descending                        colon, sigmoid colon and rectum for evaluation of                        microscopic colitis. Recommendation:       - Discharge patient to home.                       - Continue present medications, change from 8 pills                        taken at one time to 4 taken twice a day.                       - Await pathology results.                       - Telephone GI clinic for pathology results in 1 week. Procedure Code(s):    --- Professional ---                       [phone], Colonoscopy, flexible; with biopsy, single or                        multiple Diagnosis Code(s):    --- Professional ---                       D12.3, Benign neoplasm of transverse colon (hepatic                        flexure or splenic flexure)  K64.0, First degree hemorrhoids                       Z87.19, Personal history of other diseases of the                        digestive system                       K51.90, Ulcerative colitis, unspecified, without                        complications CPT copyright 2016 American Medical Association. All rights reserved. The codes documented in this report are preliminary and upon  coder review may  be revised to meet current compliance requirements. Lollie Sails, MD 04/12/2017 8:30:58 AM This report has been signed electronically. Number of Addenda: 0 Note Initiated On: 04/12/2017 7:37 AM Scope Withdrawal Time: 0 hours 21 minutes 39 seconds  Total Procedure Duration: 0 hours 37 minutes 25 seconds       [facility]

## 2017-04-12 NOTE — Anesthesia Preprocedure Evaluation (Signed)
Anesthesia Evaluation  Patient identified by MRN, date of birth, ID band Patient awake    Reviewed: Allergy & Precautions, NPO status , Patient's Chart, lab work & pertinent test results, reviewed documented beta blocker date and time   Airway Mallampati: II  TM Distance: >3 FB     Dental  (+) Chipped   Pulmonary           Cardiovascular hypertension, Pt. on medications      Neuro/Psych    GI/Hepatic PUD, GERD  ,  Endo/Other    Renal/GU      Musculoskeletal  (+) Arthritis ,   Abdominal   Peds  Hematology  (+) anemia ,   Anesthesia Other Findings   Reproductive/Obstetrics                             Anesthesia Physical Anesthesia Plan  ASA: III  Anesthesia Plan: General   Post-op Pain Management:    Induction: Intravenous  Airway Management Planned:   Additional Equipment:   Intra-op Plan:   Post-operative Plan:   Informed Consent: I have reviewed the patients History and Physical, chart, labs and discussed the procedure including the risks, benefits and alternatives for the proposed anesthesia with the patient or authorized representative who has indicated his/her understanding and acceptance.     Plan Discussed with: CRNA  Anesthesia Plan Comments:         Anesthesia Quick Evaluation

## 2017-04-12 NOTE — Transfer of Care (Signed)
Immediate Anesthesia Transfer of Care Note  Patient: Brianna Stevenson  Procedure(s) Performed: Procedure(s): COLONOSCOPY WITH PROPOFOL (N/A)  Patient Location: PACU and Endoscopy Unit  Anesthesia Type:General  Level of Consciousness: awake, oriented and patient cooperative  Airway & Oxygen Therapy: Patient Spontanous Breathing and Patient connected to nasal cannula oxygen  Post-op Assessment: Report given to RN and Post -op Vital signs reviewed and stable  Post vital signs: Reviewed and stable  Last Vitals:  Vitals:   04/12/17 0658  BP: 124/71  Pulse: 83  Resp: 16  Temp: 36.2 C    Last Pain:  Vitals:   04/12/17 0658  TempSrc: Tympanic      Patients Stated Pain Goal: 0 (56/25/63 8937)  Complications: No apparent anesthesia complications

## 2017-04-14 LAB — SURGICAL PATHOLOGY

## 2017-04-19 ENCOUNTER — Encounter: Payer: Self-pay | Admitting: Gastroenterology

## 2018-01-02 ENCOUNTER — Other Ambulatory Visit: Payer: Self-pay | Admitting: Family Medicine

## 2018-01-02 DIAGNOSIS — Z1231 Encounter for screening mammogram for malignant neoplasm of breast: Secondary | ICD-10-CM

## 2018-01-24 ENCOUNTER — Ambulatory Visit
Admission: RE | Admit: 2018-01-24 | Discharge: 2018-01-24 | Disposition: A | Discharge status: Home / Self Care | Payer: 59 | Source: Ambulatory Visit | Attending: Family Medicine | Admitting: Family Medicine

## 2018-01-24 DIAGNOSIS — Z1231 Encounter for screening mammogram for malignant neoplasm of breast: Secondary | ICD-10-CM

## 2018-11-30 ENCOUNTER — Other Ambulatory Visit
Admission: RE | Admit: 2018-11-30 | Discharge: 2018-11-30 | Disposition: A | Discharge status: Home / Self Care | Payer: 59 | Source: Ambulatory Visit | Attending: Gastroenterology | Admitting: Gastroenterology

## 2018-11-30 DIAGNOSIS — R197 Diarrhea, unspecified: Secondary | ICD-10-CM | POA: Diagnosis not present

## 2018-11-30 LAB — GASTROINTESTINAL PANEL BY PCR, STOOL (REPLACES STOOL CULTURE)
ASTROVIRUS: NOT DETECTED
Adenovirus F40/41: NOT DETECTED
CYCLOSPORA CAYETANENSIS: NOT DETECTED
Campylobacter species: NOT DETECTED
Cryptosporidium: NOT DETECTED
ENTAMOEBA HISTOLYTICA: NOT DETECTED
ENTEROAGGREGATIVE E COLI (EAEC): NOT DETECTED
ENTEROTOXIGENIC E COLI (ETEC): NOT DETECTED
Enteropathogenic E coli (EPEC): NOT DETECTED
GIARDIA LAMBLIA: NOT DETECTED
NOROVIRUS GI/GII: NOT DETECTED
Plesimonas shigelloides: NOT DETECTED
Rotavirus A: NOT DETECTED
SALMONELLA SPECIES: NOT DETECTED
SAPOVIRUS (I, II, IV, AND V): NOT DETECTED
SHIGA LIKE TOXIN PRODUCING E COLI (STEC): NOT DETECTED
Shigella/Enteroinvasive E coli (EIEC): NOT DETECTED
VIBRIO SPECIES: NOT DETECTED
Vibrio cholerae: NOT DETECTED
Yersinia enterocolitica: NOT DETECTED

## 2018-11-30 LAB — C DIFFICILE QUICK SCREEN W PCR REFLEX
C DIFFICLE (CDIFF) ANTIGEN: NEGATIVE
C Diff interpretation: NOT DETECTED
C Diff toxin: NEGATIVE

## 2019-01-12 ENCOUNTER — Other Ambulatory Visit: Payer: Self-pay | Admitting: Family Medicine

## 2019-01-12 DIAGNOSIS — Z1231 Encounter for screening mammogram for malignant neoplasm of breast: Secondary | ICD-10-CM

## 2019-01-22 ENCOUNTER — Encounter: Payer: Self-pay | Admitting: *Deleted

## 2019-01-23 ENCOUNTER — Encounter
Admission: RE | Disposition: A | Discharge status: Home / Self Care | Payer: Self-pay | Source: Home / Self Care | Attending: Gastroenterology

## 2019-01-23 ENCOUNTER — Ambulatory Visit: Payer: 59 | Admitting: Anesthesiology

## 2019-01-23 ENCOUNTER — Ambulatory Visit
Admission: RE | Admit: 2019-01-23 | Discharge: 2019-01-23 | Disposition: A | Discharge status: Home / Self Care | Payer: 59 | Attending: Gastroenterology | Admitting: Gastroenterology

## 2019-01-23 ENCOUNTER — Encounter: Payer: Self-pay | Admitting: Anesthesiology

## 2019-01-23 DIAGNOSIS — M1990 Unspecified osteoarthritis, unspecified site: Secondary | ICD-10-CM | POA: Diagnosis not present

## 2019-01-23 DIAGNOSIS — K449 Diaphragmatic hernia without obstruction or gangrene: Secondary | ICD-10-CM | POA: Diagnosis not present

## 2019-01-23 DIAGNOSIS — E782 Mixed hyperlipidemia: Secondary | ICD-10-CM | POA: Diagnosis not present

## 2019-01-23 DIAGNOSIS — K5190 Ulcerative colitis, unspecified, without complications: Secondary | ICD-10-CM | POA: Diagnosis not present

## 2019-01-23 DIAGNOSIS — K6389 Other specified diseases of intestine: Secondary | ICD-10-CM | POA: Insufficient documentation

## 2019-01-23 DIAGNOSIS — Z1211 Encounter for screening for malignant neoplasm of colon: Secondary | ICD-10-CM | POA: Diagnosis not present

## 2019-01-23 DIAGNOSIS — K2210 Ulcer of esophagus without bleeding: Secondary | ICD-10-CM | POA: Diagnosis not present

## 2019-01-23 DIAGNOSIS — Z79899 Other long term (current) drug therapy: Secondary | ICD-10-CM | POA: Insufficient documentation

## 2019-01-23 DIAGNOSIS — K2970 Gastritis, unspecified, without bleeding: Secondary | ICD-10-CM | POA: Diagnosis not present

## 2019-01-23 DIAGNOSIS — E559 Vitamin D deficiency, unspecified: Secondary | ICD-10-CM | POA: Insufficient documentation

## 2019-01-23 DIAGNOSIS — K317 Polyp of stomach and duodenum: Secondary | ICD-10-CM | POA: Insufficient documentation

## 2019-01-23 DIAGNOSIS — D649 Anemia, unspecified: Secondary | ICD-10-CM | POA: Diagnosis not present

## 2019-01-23 DIAGNOSIS — I10 Essential (primary) hypertension: Secondary | ICD-10-CM | POA: Insufficient documentation

## 2019-01-23 DIAGNOSIS — D123 Benign neoplasm of transverse colon: Secondary | ICD-10-CM | POA: Insufficient documentation

## 2019-01-23 DIAGNOSIS — K529 Noninfective gastroenteritis and colitis, unspecified: Secondary | ICD-10-CM | POA: Diagnosis not present

## 2019-01-23 DIAGNOSIS — K210 Gastro-esophageal reflux disease with esophagitis: Secondary | ICD-10-CM | POA: Insufficient documentation

## 2019-01-23 DIAGNOSIS — Z800 Family history of malignant neoplasm of digestive organs: Secondary | ICD-10-CM | POA: Insufficient documentation

## 2019-01-23 HISTORY — PX: ESOPHAGOGASTRODUODENOSCOPY (EGD) WITH PROPOFOL: SHX5813

## 2019-01-23 HISTORY — PX: COLONOSCOPY WITH PROPOFOL: SHX5780

## 2019-01-23 HISTORY — DX: Benign neoplasm of stomach: D13.1

## 2019-01-23 HISTORY — DX: Personal history of other infectious and parasitic diseases: Z86.19

## 2019-01-23 HISTORY — DX: Ulcer of esophagus without bleeding: K22.10

## 2019-01-23 SURGERY — COLONOSCOPY WITH PROPOFOL
Anesthesia: General

## 2019-01-23 MED ORDER — SODIUM CHLORIDE 0.9 % IV SOLN
INTRAVENOUS | Status: DC
  Administered 2019-01-23: 08:00:00 via INTRAVENOUS

## 2019-01-23 MED ORDER — EPHEDRINE SULFATE 50 MG/ML IJ SOLN
INTRAMUSCULAR | Status: DC | PRN
  Administered 2019-01-23 (×2): 10 mg via INTRAVENOUS

## 2019-01-23 MED ORDER — PROPOFOL 500 MG/50ML IV EMUL
INTRAVENOUS | Status: AC
  Filled 2019-01-23: qty 50

## 2019-01-23 MED ORDER — GLYCOPYRROLATE 0.2 MG/ML IJ SOLN
INTRAMUSCULAR | Status: AC
  Filled 2019-01-23: qty 1

## 2019-01-23 MED ORDER — FENTANYL CITRATE (PF) 100 MCG/2ML IJ SOLN
INTRAMUSCULAR | Status: AC
  Filled 2019-01-23: qty 2

## 2019-01-23 MED ORDER — PROPOFOL 10 MG/ML IV BOLUS
INTRAVENOUS | Status: DC | PRN
  Administered 2019-01-23: 100 mg via INTRAVENOUS

## 2019-01-23 MED ORDER — PROPOFOL 500 MG/50ML IV EMUL
INTRAVENOUS | Status: DC | PRN
  Administered 2019-01-23: 160 ug/kg/min via INTRAVENOUS

## 2019-01-23 MED ORDER — LIDOCAINE 2% (20 MG/ML) 5 ML SYRINGE
INTRAMUSCULAR | Status: DC | PRN
  Administered 2019-01-23: 30 mg via INTRAVENOUS

## 2019-01-23 MED ORDER — FENTANYL CITRATE (PF) 100 MCG/2ML IJ SOLN
INTRAMUSCULAR | Status: DC | PRN
  Administered 2019-01-23 (×2): 50 ug via INTRAVENOUS

## 2019-01-23 MED ORDER — LIDOCAINE HCL (PF) 2 % IJ SOLN
INTRAMUSCULAR | Status: AC
  Filled 2019-01-23: qty 10

## 2019-01-23 MED ORDER — PROPOFOL 10 MG/ML IV BOLUS
INTRAVENOUS | Status: AC
  Filled 2019-01-23: qty 20

## 2019-01-23 MED ORDER — SPOT INK MARKER SYRINGE KIT
PACK | SUBMUCOSAL | Status: DC | PRN
  Administered 2019-01-23: 1.5 mL via SUBMUCOSAL

## 2019-01-23 MED ORDER — PHENYLEPHRINE HCL 10 MG/ML IJ SOLN
INTRAMUSCULAR | Status: DC | PRN
  Administered 2019-01-23: 100 ug via INTRAVENOUS

## 2019-01-23 NOTE — Anesthesia Post-op Follow-up Note (Signed)
Anesthesia QCDR form completed.        

## 2019-01-23 NOTE — Op Note (Signed)
[facility] Gastroenterology Patient Name: Brianna Stevenson Procedure Date: 01/23/2019 7:31 AM MRN: [id number] Account #: [id number] Date of Birth: 08-Apr-1951 Admit Type: Outpatient Age: 68 Room: [facility] ENDO ROOM 3 Gender: Female Note Status: Finalized Procedure:            Upper GI endoscopy Indications:          Epigastric abdominal pain, Dyspepsia Providers:            Lollie Sails, MD Referring MD:         Bo Mcclintock. Vickki Muff (Referring MD) Medicines:            Monitored Anesthesia Care Complications:        No immediate complications. Procedure:            Pre-Anesthesia Assessment:                       - ASA Grade Assessment: III - A patient with severe                        systemic disease.                       After obtaining informed consent, the endoscope was                        passed under direct vision. Throughout the procedure,                        the patient's blood pressure, pulse, and oxygen                        saturations were monitored continuously. The Endoscope                        was introduced through the mouth, and advanced to the                        third part of duodenum. The upper GI endoscopy was                        accomplished without difficulty. The patient tolerated                        the procedure well. Findings:      LA Grade A (one or more mucosal breaks less than 5 mm, not extending       between tops of 2 mucosal folds) esophagitis with no bleeding was found.       Biopsies were taken with a cold forceps for histology.      A small hiatal hernia was present.      The exam of the esophagus was otherwise normal.      Patchy minimal inflammation characterized by congestion (edema) and       erythema was found in the gastric antrum. Biopsies were taken with a       cold forceps for histology.      The cardia and gastric fundus were normal on retroflexion otherwise.      Multiple 2 to 7 mm pedunculated and  sessile polyps with no bleeding and       no stigmata of recent bleeding were found in the gastric  body. Biopsies       were taken with a cold forceps for histology.      The exam was otherwise without abnormality.      The second portion of the duodenum and third portion of the duodenum       were normal. Biopsies were taken with a cold forceps for histology.      A few small sessile polyps with no bleeding were found in the duodenal       bulb. Biopsies were taken with a cold forceps for histology. Impression:           - LA Grade A erosive esophagitis. Biopsied.                       - Small hiatal hernia.                       - Gastritis. Biopsied.                       - Multiple gastric polyps. Biopsied.                       - The examination was otherwise normal.                       - Normal second portion of the duodenum and third                        portion of the duodenum. Biopsied.                       - A few duodenal polyps. Biopsied. Recommendation:       - Discharge patient to home.                       - Continue present medications.                       - Await pathology results.                       - Return to GI clinic in 3 weeks. Procedure Code(s):    --- Professional ---                       [id number], Esophagogastroduodenoscopy, flexible, transoral;                        with biopsy, single or multiple Diagnosis Code(s):    --- Professional ---                       K20.8, Other esophagitis                       K44.9, Diaphragmatic hernia without obstruction or                        gangrene                       K29.70, Gastritis, unspecified, without bleeding                       K31.7, Polyp of stomach  and duodenum                       R10.13, Epigastric pain CPT copyright 2018 American Medical Association. All rights reserved. The codes documented in this report are preliminary and upon coder review may  be revised to meet current compliance  requirements. Lollie Sails, MD 01/23/2019 8:24:39 AM This report has been signed electronically. Number of Addenda: 0 Note Initiated On: 01/23/2019 7:31 AM      [facility]

## 2019-01-23 NOTE — H&P (Signed)
Outpatient short stay form Pre-procedure 01/23/2019 7:35 AM Brianna Stevenson  Primary Physician: Brianna Clay, Stevenson  Reason for visit: EGD and colonoscopy  History of present illness: Patient is a 68 year old female presenting today for an EGD and colonoscopy.  She has a history of some dyspepsia and recently diarrhea.  She does have a personal history of ulcerative colitis and takes Azulfidine.  Been having some epigastric pain after eating.  Her primary physician did a CT scan showing some abnormal thickening in the sigmoid and rectum region.  It is of note patient also has a family history of colon cancer in primary relative, mother.  He has had some increased symptoms in regards to her colitis recently as well as some increased epigastric pain.  This seems to be improved with taking her proton pump inhibitor more appropriately.  Tolerated her prep well.  She takes no aspirin or blood thinning agent.    Current Facility-Administered Medications:  .  0.9 %  sodium chloride infusion, , Intravenous, Continuous, Brianna Sails, Stevenson .  0.9 %  sodium chloride infusion, , Intravenous, Continuous, Brianna Sails, Stevenson  Medications Prior to Admission  Medication Sig Dispense Refill Last Dose  . amLODipine-benazepril (LOTREL) 5-20 MG capsule Take 1 capsule by mouth daily.   01/22/2019 at Unknown time  . ampicillin (PRINCIPEN) 500 MG capsule Take 500 mg by mouth daily.    01/22/2019 at Unknown time  . esomeprazole (NEXIUM) 40 MG capsule Take 40 mg by mouth 2 (two) times daily before a meal.   01/22/2019 at Unknown time  . Estradiol (VAGIFEM) 10 MCG TABS vaginal tablet Place 10 mcg vaginally 2 (two) times a week.   Past Week at Unknown time  . folic acid (FOLVITE) 1 MG tablet Take 1 mg by mouth daily.   01/22/2019 at Unknown time  . hydrochlorothiazide (HYDRODIURIL) 12.5 MG tablet Take 12.5 mg by mouth daily.   01/22/2019 at Unknown time  . metroNIDAZOLE (METROGEL) 1 % gel Apply topically daily.    01/22/2019 at Unknown time  . pantoprazole (PROTONIX) 40 MG tablet Take 40 mg by mouth daily.   01/22/2019 at Unknown time  . potassium chloride (K-DUR,KLOR-CON) 10 MEQ tablet Take 10 mEq by mouth 2 (two) times daily.   01/22/2019 at Unknown time  . rosuvastatin (CRESTOR) 10 MG tablet Take 10 mg by mouth daily.   01/22/2019 at Unknown time  . sucralfate (CARAFATE) 1 g tablet Take 1 g by mouth 4 (four) times daily -  with meals and at bedtime.   01/22/2019 at Unknown time  . sulfaSALAzine (AZULFIDINE) 500 MG tablet Take 500 mg by mouth 4 (four) times daily.   01/22/2019 at Unknown time  . valACYclovir (VALTREX) 1000 MG tablet Take 1,000 mg by mouth 2 (two) times daily.   Past Month at Unknown time  . escitalopram (LEXAPRO) 10 MG tablet Take 10 mg by mouth daily.   Not Taking at Unknown time  . fluticasone (FLONASE) 50 MCG/ACT nasal spray Place 2 sprays into both nostrils daily. 16 g 2 11/22/2018  . oxyCODONE-acetaminophen (PERCOCET) 7.5-325 MG tablet Take 1 tablet by mouth every 4 (four) hours as needed for severe pain. (Patient not taking: Reported on 04/12/2017) 30 tablet 0 Not Taking at Unknown time     No Known Allergies   Past Medical History:  Diagnosis Date  . Anemia   . Arthritis   . Cholelithiases   . Colon polyp   . Erosive esophagitis   .  Fundic gland polyps of stomach, benign   . GERD (gastroesophageal reflux disease)   . H/O cold sores   . History of augmentation mammoplasty   . Hypertension   . Mixed hyperlipidemia   . Ulcerative colitis ([place])   . Vitamin D deficiency disease     Review of systems:      Physical Exam    Heart and lungs: Him without rub or gallop, lungs are bilaterally clear.    HEENT: Normocephalic atraumatic eyes are anicteric    Other:    Pertinant exam for procedure: Soft nontender nondistended bowel sounds positive normoactive    Planned proceedures: EGD, colonoscopy and indicated procedures. I have discussed the risks benefits and  complications of procedures to include not limited to bleeding, infection, perforation and the risk of sedation and the patient wishes to proceed.    Brianna Sails, Stevenson Gastroenterology 01/23/2019  7:35 AM

## 2019-01-23 NOTE — Transfer of Care (Signed)
Immediate Anesthesia Transfer of Care Note  Patient: Brianna Stevenson  Procedure(s) Performed: COLONOSCOPY WITH PROPOFOL (N/A ) ESOPHAGOGASTRODUODENOSCOPY (EGD) WITH PROPOFOL (N/A )  Patient Location: PACU and Endoscopy Unit  Anesthesia Type:General  Level of Consciousness: awake and patient cooperative  Airway & Oxygen Therapy: Patient Spontanous Breathing and Patient connected to nasal cannula oxygen  Post-op Assessment: Report given to RN and Post -op Vital signs reviewed and stable  Post vital signs: Reviewed and stable  Last Vitals:  Vitals Value Taken Time  BP    Temp    Pulse    Resp    SpO2      Last Pain:  Vitals:   01/23/19 0725  TempSrc: Tympanic  PainSc: 0-No pain         Complications: No apparent anesthesia complications

## 2019-01-23 NOTE — Anesthesia Postprocedure Evaluation (Signed)
Anesthesia Post Note  Patient: Brianna Stevenson  Procedure(s) Performed: COLONOSCOPY WITH PROPOFOL (N/A ) ESOPHAGOGASTRODUODENOSCOPY (EGD) WITH PROPOFOL (N/A )  Patient location during evaluation: Endoscopy Anesthesia Type: General Level of consciousness: awake and alert Pain management: pain level controlled Vital Signs Assessment: post-procedure vital signs reviewed and stable Respiratory status: spontaneous breathing, nonlabored ventilation, respiratory function stable and patient connected to nasal cannula oxygen Cardiovascular status: blood pressure returned to baseline and stable Postop Assessment: no apparent nausea or vomiting Anesthetic complications: no     Last Vitals:  Vitals:   01/23/19 0946 01/23/19 0956  BP: 119/68 125/71  Pulse: 63 61  Resp: 15 16  Temp:    SpO2: 98% 98%    Last Pain:  Vitals:   01/23/19 0956  TempSrc:   PainSc: 0-No pain                 Roseanna Koplin S

## 2019-01-23 NOTE — Op Note (Signed)
[facility] Gastroenterology Patient Name: Brianna Stevenson Procedure Date: 01/23/2019 7:30 AM MRN: [id number] Account #: [id number] Date of Birth: 11/10/1951 Admit Type: Outpatient Age: 68 Room: [facility] ENDO ROOM 3 Gender: Female Note Status: Finalized Procedure:            Colonoscopy Indications:          Family history of colon cancer in a first-degree                        relative, Personal history of ulcerative colitis Providers:            Lollie Sails, MD Referring MD:         Bo Mcclintock. Vickki Muff (Referring MD) Medicines:            Monitored Anesthesia Care Complications:        No immediate complications. Procedure:            Pre-Anesthesia Assessment:                       - ASA Grade Assessment: III - A patient with severe                        systemic disease.                       After obtaining informed consent, the colonoscope was                        passed under direct vision. Throughout the procedure,                        the patient's blood pressure, pulse, and oxygen                        saturations were monitored continuously. The                        Colonoscope was introduced through the anus and                        advanced to the the ileocecal valve, despite multiple                        position changes and abdominal support I was not able                        to intubate the base of the cecum, though there is no                        abnormal appearance noted . The colonoscopy was                        performed with moderate difficulty due to a redundant                        colon and significant looping. Successful completion of                        the procedure was aided by changing the patient to a  supine position, changing the patient to a prone                        position, using manual pressure and withdrawing and                        reinserting the scope. The patient tolerated the                         procedure well. Findings:      much of the length of the colon showed a tubular appearance without       prominant haustral markings.      A 10 mm polyp was found in the mid transverse colon. The polyp was       multi-lobulated and sessile. The polyp was removed with a hot snare.       Resection and retrieval were complete. Area about 2 cm proximal to the       site was tattooed with an injection of 1.5 mL of [place] ink.      A localized area of granular mucosa was found in the mid transverse       colon. Biopsies were taken with a cold forceps for histology.      A diffuse area of granular mucosa was found in the entire colon.       Biopsies for histology were taken with a cold forceps from the ascending       colon, right colon, left colon, right transverse colon, left transverse       colon, descending colon, sigmoid colon and rectum for evaluation of       microscopic colitis.      A 5 mm polyp was found in the proximal transverse colon. The polyp was       sessile. The polyp was removed with a cold snare. Resection and       retrieval were complete.      A 2 mm polyp was found in the mid transverse colon. The polyp was       sessile. The polyp was removed with a cold biopsy forceps. Resection and       retrieval were complete. Impression:           - One 10 mm polyp in the mid transverse colon, removed                        with a hot snare. Resected and retrieved. Tattooed.                       - Granularity in the mid transverse colon. Biopsied.                       - Granularity in the entire examined colon. Biopsied. Recommendation:       - Discharge patient to home.                       - Full liquid diet today.                       - Soft diet for 2 days, then advance as tolerated to  advance diet as tolerated.                       - Await pathology results.                       - Continue present medications. Procedure  Code(s):    --- Professional ---                       [id number], Colonoscopy, flexible; with removal of tumor(s),                        polyp(s), or other lesion(s) by snare technique                       45381, Colonoscopy, flexible; with directed submucosal                        injection(s), any substance                       93570, 69, Colonoscopy, flexible; with biopsy, single                        or multiple Diagnosis Code(s):    --- Professional ---                       D12.3, Benign neoplasm of transverse colon (hepatic                        flexure or splenic flexure)                       K63.89, Other specified diseases of intestine                       Z80.0, Family history of malignant neoplasm of                        digestive organs                       Z87.19, Personal history of other diseases of the                        digestive system CPT copyright 2018 American Medical Association. All rights reserved. The codes documented in this report are preliminary and upon coder review may  be revised to meet current compliance requirements. Lollie Sails, MD 01/23/2019 9:30:29 AM This report has been signed electronically. Number of Addenda: 0 Note Initiated On: 01/23/2019 7:30 AM Scope Withdrawal Time: 0 hours 18 minutes 2 seconds  Total Procedure Duration: 0 hours 50 minutes 54 seconds       [facility]

## 2019-01-23 NOTE — Anesthesia Preprocedure Evaluation (Signed)
Anesthesia Evaluation  Patient identified by MRN, date of birth, ID band Patient awake    Reviewed: Allergy & Precautions, NPO status , Patient's Chart, lab work & pertinent test results, reviewed documented beta blocker date and time   Airway Mallampati: II  TM Distance: >3 FB     Dental  (+) Chipped   Pulmonary           Cardiovascular hypertension, Pt. on medications      Neuro/Psych    GI/Hepatic PUD, GERD  ,  Endo/Other    Renal/GU      Musculoskeletal  (+) Arthritis ,   Abdominal   Peds  Hematology  (+) anemia ,   Anesthesia Other Findings   Reproductive/Obstetrics                             Anesthesia Physical Anesthesia Plan  ASA: III  Anesthesia Plan: General   Post-op Pain Management:    Induction:   PONV Risk Score and Plan:   Airway Management Planned:   Additional Equipment:   Intra-op Plan:   Post-operative Plan:   Informed Consent: I have reviewed the patients History and Physical, chart, labs and discussed the procedure including the risks, benefits and alternatives for the proposed anesthesia with the patient or authorized representative who has indicated his/her understanding and acceptance.       Plan Discussed with: CRNA  Anesthesia Plan Comments:         Anesthesia Quick Evaluation

## 2019-01-24 ENCOUNTER — Encounter: Payer: Self-pay | Admitting: Gastroenterology

## 2019-01-25 LAB — SURGICAL PATHOLOGY

## 2019-02-06 ENCOUNTER — Ambulatory Visit
Admission: RE | Admit: 2019-02-06 | Discharge: 2019-02-06 | Disposition: A | Discharge status: Home / Self Care | Payer: 59 | Source: Ambulatory Visit | Attending: Family Medicine | Admitting: Family Medicine

## 2019-02-06 DIAGNOSIS — Z1231 Encounter for screening mammogram for malignant neoplasm of breast: Secondary | ICD-10-CM

## 2019-05-08 ENCOUNTER — Other Ambulatory Visit: Payer: Self-pay | Admitting: Gastroenterology

## 2019-05-08 DIAGNOSIS — R1013 Epigastric pain: Secondary | ICD-10-CM

## 2019-05-14 ENCOUNTER — Ambulatory Visit: Payer: 59

## 2019-05-21 ENCOUNTER — Ambulatory Visit
Admission: RE | Admit: 2019-05-21 | Discharge: 2019-05-21 | Disposition: A | Discharge status: Home / Self Care | Payer: 59 | Source: Ambulatory Visit | Attending: Gastroenterology | Admitting: Gastroenterology

## 2019-05-21 ENCOUNTER — Encounter
Admission: RE | Admit: 2019-05-21 | Discharge: 2019-05-21 | Disposition: A | Discharge status: Home / Self Care | Payer: 59 | Source: Ambulatory Visit | Attending: Gastroenterology | Admitting: Gastroenterology

## 2019-05-21 ENCOUNTER — Other Ambulatory Visit: Payer: Self-pay

## 2019-05-21 DIAGNOSIS — R1013 Epigastric pain: Secondary | ICD-10-CM

## 2019-05-21 MED ORDER — TECHNETIUM TC 99M MEBROFENIN IV KIT
5.0000 | PACK | Freq: Once | INTRAVENOUS | Status: AC | PRN
  Administered 2019-05-21: 5.16 via INTRAVENOUS

## 2020-08-18 ENCOUNTER — Other Ambulatory Visit: Payer: Self-pay | Admitting: Family Medicine

## 2020-08-18 DIAGNOSIS — Z0000 Encounter for general adult medical examination without abnormal findings: Secondary | ICD-10-CM

## 2020-08-27 ENCOUNTER — Other Ambulatory Visit: Payer: Self-pay

## 2020-08-27 ENCOUNTER — Ambulatory Visit
Admission: RE | Admit: 2020-08-27 | Discharge: 2020-08-27 | Disposition: A | Discharge status: Home / Self Care | Payer: Medicare Other | Source: Ambulatory Visit | Attending: Family Medicine | Admitting: Family Medicine

## 2020-08-27 DIAGNOSIS — Z0000 Encounter for general adult medical examination without abnormal findings: Secondary | ICD-10-CM

## 2021-08-31 ENCOUNTER — Other Ambulatory Visit: Payer: Self-pay | Admitting: Family Medicine

## 2021-08-31 DIAGNOSIS — Z1231 Encounter for screening mammogram for malignant neoplasm of breast: Secondary | ICD-10-CM

## 2021-09-18 ENCOUNTER — Ambulatory Visit
Admission: RE | Admit: 2021-09-18 | Discharge: 2021-09-18 | Disposition: A | Discharge status: Home / Self Care | Payer: Medicare Other | Source: Ambulatory Visit

## 2021-09-18 ENCOUNTER — Other Ambulatory Visit: Payer: Self-pay

## 2021-09-18 DIAGNOSIS — Z1231 Encounter for screening mammogram for malignant neoplasm of breast: Secondary | ICD-10-CM

## 2021-12-10 ENCOUNTER — Ambulatory Visit: Admit: 2021-12-10 | Discharge: 2021-12-10 | Payer: MEDICARE | Attending: Medical

## 2021-12-10 DIAGNOSIS — R509 Fever, unspecified: Secondary | ICD-10-CM

## 2021-12-10 LAB — POC COVID-19 & INFLUENZA COMBO (LIAT IN HOUSE)
INFLUENZA A: NOT DETECTED
INFLUENZA B: NOT DETECTED
SARS-CoV-2: DETECTED — AB

## 2021-12-10 MED ORDER — PSEUDOEPH-BROMPHEN-DM 30-2-10 MG/5ML PO SYRP
2-30-105 MG/5ML | Freq: Four times a day (QID) | ORAL | 0 refills | Status: AC | PRN
Start: 2021-12-10 — End: 2022-01-08

## 2021-12-10 NOTE — Progress Notes (Addendum)
Erin Blackwell (DOB:  1951-10-01) is a 71 y.o. female, here for evaluation of the following chief complaint(s):  Pharyngitis, Generalized Body Aches, Fever, Fatigue, Congestion, and Cough    Review of Systems   All other systems reviewed and are negative.    History of Present Illness:   71yo female presents with sore throat, cough, chills, body aches, head congestion. She denies chest tightness, SOB, wheezing, She had some N/V yesterday. Her symptoms started Tuesday night. She has been taking tylenol for symptoms - recently moved and does not have any cold/cough meds at home yet. She denies known exposure to covid or flu.     Physical Exam  Vitals and nursing note reviewed.   Constitutional:       Appearance: Normal appearance. She is ill-appearing.   HENT:      Head: Normocephalic and atraumatic.      Ears:      Comments: TM's mildly dull with reduced light reflex, mild bulge     Mouth/Throat:      Comments: Mild injection and clear drainage posteriorly  Eyes:      Extraocular Movements: Extraocular movements intact.   Cardiovascular:      Rate and Rhythm: Normal rate and regular rhythm.   Pulmonary:      Effort: Pulmonary effort is normal.      Breath sounds: Normal breath sounds.   Musculoskeletal:         General: Normal range of motion.      Cervical back: Normal range of motion.   Skin:     General: Skin is warm and dry.   Neurological:      General: No focal deficit present.      Mental Status: She is alert and oriented to person, place, and time.   Psychiatric:         Mood and Affect: Mood normal.         Behavior: Behavior normal.       ASSESSMENT/PLAN:  Pt with viral symptoms, covid/flu swab ordered. (+) covid, pt informed. Advised to increase fluids, continue with tylenol for fever, body as - she is unable to take NSAIDS secondary GI issues. Paxlovid discussed. Bromfed DM prescribed for cough. Pt advised to follow-up for persistent/worsening symptoms, ER preparations advised. Pt informed to notify recent  close contacts of covid diagnosis, should quarantine for at least 5 days, mask for an additional 5 days after symptoms resolve.    No follow-ups on file.     No Known Allergies  Vitals:    12/10/21 0904   BP: 126/82   Pulse: 86   Resp: 18   Temp: 100.1 ??F (37.8 ??C)   SpO2: 98%      Results for orders placed or performed in visit on 12/10/21   POC COVID-19 & Influenza Combo ([facility])   Result Value Ref Range    SARS-CoV-2 Detected (A) Not Detected    INFLUENZA A Not Detected Not Detected    INFLUENZA B Not Detected Not Detected     Results for orders placed or performed in visit on 12/10/21   POC COVID-19 & Influenza Combo ([facility])   Result Value Ref Range    SARS-CoV-2 Detected (A) Not Detected    INFLUENZA A Not Detected Not Detected    INFLUENZA B Not Detected Not Detected    Narrative    Is this test for diagnosis or screening?->Diagnosis of ill patient  Symptomatic for COVID-19 as defined by CDC?->Yes  Date of Symptom Onset->12/08/21  Hospitalized for COVID-19?->No  Admitted to ICU for COVID-19?->No  Employed in healthcare setting?->Unknown  Resident in a congregate (group) care setting?->Unknown  Pregnant?->No  Previously tested for COVID-19?->Unknown      Visit Diagnoses         Codes    Fever, unspecified fever cause    -  Primary R50.9    COVID-19     U07.1    Acute cough     R05.1           No problem-specific Assessment & Plan notes found for this encounter.    Electronically signed by:  --Louanna Raw, PA

## 2022-01-08 ENCOUNTER — Ambulatory Visit: Admit: 2022-01-08 | Discharge: 2022-01-08 | Payer: MEDICARE | Attending: Family Medicine | Primary: Family Medicine

## 2022-01-08 DIAGNOSIS — Z1231 Encounter for screening mammogram for malignant neoplasm of breast: Secondary | ICD-10-CM

## 2022-01-08 NOTE — Assessment & Plan Note (Signed)
Likely osteoarthritis and right knee.  Discussed treatment options with patient including topical, oral pain medications, steroid injections, stretching.  Patient to continue with topical pain medications and stretching.  Patient cannot take NSAIDs due to her GI history

## 2022-01-08 NOTE — Assessment & Plan Note (Signed)
History of hyperlipidemia currently on rosuvastatin 10 mg

## 2022-01-08 NOTE — Assessment & Plan Note (Signed)
Due for DEXA scan

## 2022-01-08 NOTE — Assessment & Plan Note (Signed)
History of ulcerative colitis and mother also with history of colon cancer.  Patient was receiving colonoscopies every 3 years, last 01-Feb-2019.  Husband passed away last year after complications following colonoscopy and patient is hesitant to get another one.  Will refer to GI for discussion of colonoscopy and colon cancer screening

## 2022-01-08 NOTE — Assessment & Plan Note (Signed)
Blood pressure 124/72 in clinic.  Patient stable on spironolactone 50 mg daily

## 2022-01-08 NOTE — Assessment & Plan Note (Signed)
Due for routine screening mammogram

## 2022-01-08 NOTE — Progress Notes (Signed)
Erin Blackwell (DOB:  10-Sep-1951) is a 71 y.o. female here for evaluation of the following chief complaint(s):  Established New Doctor (Pt is here to established care, pt move from [place] almost a month now. )    SUBJECTIVE:  HPI  71 year old female with past medical history significant for ulcerative colitis, hypertension, hyperlipidemia, GERD, and osteoarthritis seen in clinic to establish care.  Patient complains of intermittent right knee pain occurs mainly when squatting.    Review of Systems  See HPI for details    Past Medical History:   Diagnosis Date    Hyperlipidemia     Hypertension     Ulcerative colitis (HCC)       Family History   Problem Relation Age of Onset    Colon Cancer Mother       Social History     Socioeconomic History    Marital status: Widowed     Spouse name: Not on file    Number of children: Not on file    Years of education: Not on file    Highest education level: Not on file   Occupational History    Not on file   Tobacco Use    Smoking status: Never    Smokeless tobacco: Never   Substance and Sexual Activity    Alcohol use: Yes     Comment: socially    Drug use: Never    Sexual activity: Not on file   Other Topics Concern    Not on file   Social History Narrative    Not on file     Social Determinants of Health     Financial Resource Strain: Not on file   Food Insecurity: Not on file   Transportation Needs: Not on file   Physical Activity: Not on file   Stress: Not on file   Social Connections: Not on file   Intimate Partner Violence: Not on file   Housing Stability: Not on file      No past surgical history on file.     OBJECTIVE:  Physical Exam  BP 124/72    Pulse 61    Ht 5\' 1"  (1.549 m)    Wt 162 lb 8 oz (73.7 kg)    SpO2 96%    BMI 30.70 kg/m??   General Appearance:  Well appearing, developed and nourished.  Appears stated age.  No acute distress.  Head:  Normocephalic, atraumatic.  Ears:   External ears normal  Nose:  Nares patent, no lesions.  Heart:  Regular rate and  regular rhythm.  No murmurs.  No rubs or gallops.  Lungs:  Clear to auscultation bilaterally with good air movement.  No focal adventitial sounds.  Skin:  Temperature normal. No rashes noted.  Musculoskeletal:  Grossly full range of motion, no swelling or deformity.  Neurologic:  Grossly non focal.  Psych:  Cognitive function intact.  Judgement and insight normal.  Mood and affect appropriate.    No results found for any visits on 01/08/22.  ASSESSMENT/PLAN:  1. Screening mammogram for breast cancer  Assessment & Plan:  Due for routine screening mammogram   Orders:  -     MAM DIGITAL SCREEN W OR WO CAD BILATERAL; Future  2. Encounter for screening for osteoporosis  Assessment & Plan:  Due for DEXA scan   Orders:  -     DEXA BONE DENSITY AXIAL SKELETON; Future  3. Encounter for colorectal cancer screening  Assessment & Plan:  History  of ulcerative colitis and mother also with history of colon cancer.  Patient was receiving colonoscopies every 3 years, last 01-27-19.  Husband passed away last year after complications following colonoscopy and patient is hesitant to get another one.  Will refer to GI for discussion of colonoscopy and colon cancer screening   Orders:  -     Joslyn Hy, DO - Gastroenterology  4. Ulcerative colitis without complications, unspecified location [facility])  Assessment & Plan:  History of ulcerative colitis currently on sulfasalazine without complications or adverse side effects.  Will refer to GI for screening colonoscopy   Orders:  -     Joslyn Hy, DO - Gastroenterology  5. Primary hypertension  Assessment & Plan:  Blood pressure 124/72 in clinic.  Patient stable on spironolactone 50 mg daily   6. Gastroesophageal reflux disease without esophagitis  Assessment & Plan:  History of GERD previously on pantoprazole but discontinued secondary to concerns for increased risk of dementia.  Has been using Pepcid as needed usually once or twice a day.  Discussed lifestyle modifications  including dietary modifications to help with symptoms.  Patient reports EGD done several years ago which was normal.  No dysphagia, hematemesis, melena or other red flags.  Will refer to GI   Orders:  Joslyn Hy, DO - Gastroenterology  7. Hyperlipidemia, unspecified hyperlipidemia type  Assessment & Plan:  History of hyperlipidemia currently on rosuvastatin 10 mg   8. Chronic pain of right knee  Assessment & Plan:  Likely osteoarthritis and right knee.  Discussed treatment options with patient including topical, oral pain medications, steroid injections, stretching.  Patient to continue with topical pain medications and stretching.  Patient cannot take NSAIDs due to her GI history   9. Asymptomatic menopausal state   -     DEXA BONE DENSITY AXIAL SKELETON; Future      Return in about 6 months (around 07/08/2022).    An electronic signature was used to authenticate this note.  Wynonia Lawman, DO

## 2022-01-08 NOTE — Assessment & Plan Note (Signed)
History of GERD previously on pantoprazole but discontinued secondary to concerns for increased risk of dementia.  Has been using Pepcid as needed usually once or twice a day.  Discussed lifestyle modifications including dietary modifications to help with symptoms.  Patient reports EGD done several years ago which was normal.  No dysphagia, hematemesis, melena or other red flags.  Will refer to GI

## 2022-01-08 NOTE — Assessment & Plan Note (Signed)
History of ulcerative colitis currently on sulfasalazine without complications or adverse side effects.  Will refer to GI for screening colonoscopy

## 2022-02-10 NOTE — Progress Notes (Signed)
Pre Procedure Patient Instructions    Procedure Location hospital:[facility]: [address] [place] - Drop off at Outpatient Services to the left of [facility] entrance and check in at the Surgery Reception desk to your immediate left.    Procedure Date 02/15/2022  Arrival Time  1:30PM      Medications:  Medication to be taken the morning of surgery with a few sips of water only:   N/A  Hold or reduce the dosage of the following medication, as discussed: VITAMINS / SUPPLEMENTS    Do not take diet medications for 4 days prior to your procedure  Stop all supplements, vitamins and herbal remedies one week prior to your procedure, unless your doctor told you to continue taking.  Do not take over the counter pain medications except plain Tylenol or acetaminophen unless your doctor told you to do so.  If you are taking blood thinners, call the doctor performing your procedure and prescribing physician for instructions on when to stop.    Procedure Preparation    Diet Restrictions:No food or drink including gum or mints -after midnight    Skin Preparation:   Wash with Hibiclens or an antibacterial soap (e.g. Dial soap) the night before and morning of procedure.  Using a clean washcloth, wash from neck to toes for 3 minutes.Gently clean the area where your surgery will be done thoroughly  Do not use Hibiclens on or near your face, eyes, ears, or head  Do not put on any deodorants, lotions, powders, or oils afterwards. Be sure to put on clean clothing.    Other Preparation:  Call your surgeon right away if you get any wounds, cuts, scrapes, scabs, rashes, bug bites at or near your operative site.  Bowel prep as instructed by your doctor  Fleets enema as instructed by your doctor  No test required before surgery      Day of Procedure Patient Instruction:  Do not smoke, vape, chew tobacco, drink alcohol or use recreational drugs on the day of your procedure  Remove all jewelry,  piercings and metal accessories  Do not wear contacts, tampons, make-up, lotions, creams, powders, fragrances or deodorant  Do not bring valuables or money  Bring a picture ID and insurance card and any of the following that are applicable to you:      ?? Storage case for eyeglasses, hearing aids, dentures, etc       ?? Copy of your Living Will and/or Medical Durable Power of Attorney    ?? List of current medications including name and dosage    .  If you are going home the same day as your procedure, a support person should accompany you to the facility and must transport you home.  If you plan to take public transportation of any sort, your support person must accompany you home.  You will need someone to stay with you for 24 hours after your procedure with sedation of any kind.         COVID Testing Instructions:  No COVID testing required as discussed.       Comments: NA    The information and visitor policy was reviewed with you during your Pre-Admission Testing interview and you verbalized understanding. If you have any additional questions please contact [phone]    For financial questions regarding your procedure at a [facility] facility, please contact [phone], option 1    For financial questions regarding anesthesia at a [facility] facility, please  contact [phone]

## 2022-02-15 ENCOUNTER — Inpatient Hospital Stay: Payer: MEDICARE

## 2022-02-15 LAB — POTASSIUM, WHOLE BLOOD: Potassium, Whole Blood: 4.4 mmol/L (ref 3.5–5.1)

## 2022-02-15 MED ORDER — LIDOCAINE HCL (PF) 1 % IJ SOLN
1 % | Freq: Once | INTRAMUSCULAR | Status: DC | PRN
Start: 2022-02-15 — End: 2022-02-15

## 2022-02-15 MED ORDER — LACTATED RINGERS IV SOLN
INTRAVENOUS | Status: DC | PRN
Start: 2022-02-15 — End: 2022-02-15
  Administered 2022-02-15: 20:00:00 via INTRAVENOUS

## 2022-02-15 MED ORDER — PROPOFOL 200 MG/20ML IV EMUL
200 MG/20ML | INTRAVENOUS | Status: AC
Start: 2022-02-15 — End: ?

## 2022-02-15 MED ORDER — LIDOCAINE HCL (PF) 2 % IJ SOLN
2 % | INTRAMUSCULAR | Status: AC
Start: 2022-02-15 — End: ?

## 2022-02-15 MED ORDER — LABETALOL HCL 5 MG/ML IV SOLN
5 MG/ML | INTRAVENOUS | Status: DC | PRN
Start: 2022-02-15 — End: 2022-02-15

## 2022-02-15 MED ORDER — LIDOCAINE HCL (PF) 2 % IJ SOLN
2 % | INTRAMUSCULAR | Status: DC | PRN
Start: 2022-02-15 — End: 2022-02-15
  Administered 2022-02-15: 20:00:00 100 via INTRAVENOUS

## 2022-02-15 MED ORDER — NORMAL SALINE FLUSH 0.9 % IV SOLN
0.9 % | INTRAVENOUS | Status: DC | PRN
Start: 2022-02-15 — End: 2022-02-15

## 2022-02-15 MED ORDER — HALOPERIDOL LACTATE 5 MG/ML IJ SOLN
5 MG/ML | Freq: Once | INTRAMUSCULAR | Status: DC | PRN
Start: 2022-02-15 — End: 2022-02-15

## 2022-02-15 MED ORDER — LIDOCAINE HCL (PF) 1 % IJ SOLN
1 % | Freq: Once | INTRAMUSCULAR | Status: AC | PRN
Start: 2022-02-15 — End: 2022-02-15

## 2022-02-15 MED ORDER — SODIUM CHLORIDE 0.9 % IV SOLN
0.9 % | INTRAVENOUS | Status: DC | PRN
Start: 2022-02-15 — End: 2022-02-15

## 2022-02-15 MED ORDER — NORMAL SALINE FLUSH 0.9 % IV SOLN
0.9 % | Freq: Two times a day (BID) | INTRAVENOUS | Status: DC
Start: 2022-02-15 — End: 2022-02-15

## 2022-02-15 MED ORDER — IPRATROPIUM-ALBUTEROL 0.5-2.5 (3) MG/3ML IN SOLN
Freq: Once | RESPIRATORY_TRACT | Status: DC | PRN
Start: 2022-02-15 — End: 2022-02-15

## 2022-02-15 MED ORDER — LACTATED RINGERS IV SOLN
INTRAVENOUS | Status: DC
Start: 2022-02-15 — End: 2022-02-15
  Administered 2022-02-15: 18:00:00 via INTRAVENOUS

## 2022-02-15 MED ORDER — DIPHENHYDRAMINE HCL 50 MG/ML IJ SOLN
50 MG/ML | Freq: Once | INTRAMUSCULAR | Status: DC | PRN
Start: 2022-02-15 — End: 2022-02-15

## 2022-02-15 MED ORDER — PROPOFOL 200 MG/20ML IV EMUL
200 MG/20ML | INTRAVENOUS | Status: DC | PRN
Start: 2022-02-15 — End: 2022-02-15
  Administered 2022-02-15: 20:00:00 70 via INTRAVENOUS
  Administered 2022-02-15: 20:00:00 80 via INTRAVENOUS
  Administered 2022-02-15: 20:00:00 30 via INTRAVENOUS
  Administered 2022-02-15: 20:00:00 50 via INTRAVENOUS
  Administered 2022-02-15: 20:00:00 40 via INTRAVENOUS
  Administered 2022-02-15: 20:00:00 30 via INTRAVENOUS
  Administered 2022-02-15: 20:00:00 60 via INTRAVENOUS
  Administered 2022-02-15: 20:00:00 40 via INTRAVENOUS
  Administered 2022-02-15: 20:00:00 50 via INTRAVENOUS

## 2022-02-15 MED ORDER — ONDANSETRON HCL 4 MG/2ML IJ SOLN
4 MG/2ML | Freq: Once | INTRAMUSCULAR | Status: DC | PRN
Start: 2022-02-15 — End: 2022-02-15

## 2022-02-15 MED ORDER — LIDOCAINE HCL (PF) 1 % IJ SOLN
1 % | INTRAMUSCULAR | Status: AC
Start: 2022-02-15 — End: 2022-02-15
  Administered 2022-02-15: 18:00:00 0.1 via INTRADERMAL

## 2022-02-15 MED FILL — DIPRIVAN 200 MG/20ML IV EMUL: 200 MG/20ML | INTRAVENOUS | Qty: 20

## 2022-02-15 MED FILL — LIDOCAINE HCL (PF) 2 % IJ SOLN: 2 % | INTRAMUSCULAR | Qty: 5

## 2022-02-15 MED FILL — XYLOCAINE-MPF 1 % IJ SOLN: 1 % | INTRAMUSCULAR | Qty: 2

## 2022-02-15 NOTE — Op Note (Signed)
Procedure: Colonoscopy with cold biopsy forceps     Endoscopist: Dr. Joslyn Hy    Date of procedure: 02/15/22     Indication: Chronic ulcerative colitis     Post-operative diagnosis:  1. Small polyp in the transverse colon within a mucosal tattoo  2. Slight patchy mucosal edema throughout the colon    Consent: Informed consent was obtained after explaining the risks, indications, benefits and alternatives. Risks including bleeding, perforation, aspiration pneumonia, missed neoplasm, adverse event from sedation, and cardiorespiratory complications were discussed with the patient. An opportunity for questions was provided, and appropriate answers were provided.    Procedure: The patient was placed in the left lateral decubitus position. Time-out was performed. A digital rectal exam was performed and sedation was achieved with the use of medications administered by anesthesia. A well lubricated Olympus colonoscope was advanced via the anal canal into the rectum, sigmoid colon, descending colon, transverse colon, ascending colon and cecum. The cecum was identified by the appendiceal orifice and ileocecal valve. The colonoscope was then slowly withdrawn to the rectum where a retroflexion was performed. The following findings were noted:     Prep: good  Terminal ileum: Normal ileal mucosa  Cecum: Normal cecal mucosa  Ascending colon: Subtle patchy mucosal edema in the ascending colon. Otherwise normal colonic mucosa, surveillance biopsies obtained using a cold biopsy forceps.   Hepatic flexure: Normal colonic mucosa   Transverse colon: A fairly large mucosal tattoo was present in the transverse colon and a 26mm polyp was present within the tattoo. This was removed using a cold biopsy forceps. Otherwise normal colonic mucosa, surveillance biopsies obtained using a cold biopsy forceps.   Splenic flexure: Normal colonic mucosa  Descending colon: Normal colonic mucosa, surveillance biopsies obtained using a cold biopsy  forceps.   Sigmoid colon: Normal colonic mucosa, surveillance biopsies obtained using a cold biopsy forceps.   Rectum: Normal rectal mucosa; random biopsies obtained.    The patient tolerated the procedure well. EKG and vital signs were monitored throughout the procedure. There were no immediate complications.    Blood loss: less than 2cc.    Specimens: Right colon, transverse colon, transverse colon polyp, descending and sigmoid colon, rectum biopsies     Plan  1. Resume regular diet  2. Continue Sulfasalazine 3000mg  daily  3. Await pathology results  4. Follow-up in 4 weeks  5. Repeat colonoscopy in 2 years

## 2022-02-15 NOTE — Anesthesia Post-Procedure Evaluation (Signed)
Department of Anesthesiology  Postprocedure Note    Patient: Erin Blackwell  MRN: [id number]  Birthdate: Apr 20, 1951  Date of evaluation: 02/15/2022      Procedure Summary     Date: 02/15/22 Room / Location: [facility] ENDO 01 / [facility] ENDOSCOPY    Anesthesia Start: 1550 Anesthesia Stop: 1642    Procedures:       EGD BIOPSY      COLONOSCOPY POLYPECTOMY SNARE/COLD BIOPSY      COLONOSCOPY WITH BIOPSY Diagnosis:       Colon cancer screening      (Colon cancer screening [Z12.11])    Surgeons: Juliette Mangle, DO Responsible Provider: Gretta Began, MD    Anesthesia Type: TIVA, General ASA Status: 2          Anesthesia Type: TIVA, General    Aldrete Phase I:      Aldrete Phase II: Aldrete Score: 10      Anesthesia Post Evaluation    Patient location during evaluation: bedside  Patient participation: complete - patient participated  Level of consciousness: awake and alert  Pain score: 0  Airway patency: patent  Nausea & Vomiting: no nausea and no vomiting  Complications: no  Cardiovascular status: hemodynamically stable  Respiratory status: room air and spontaneous ventilation  Hydration status: stable  Comments: VSS

## 2022-02-15 NOTE — H&P (Signed)
HISTORY AND PHYSICAL             Date: 02/15/2022        Patient Name: Erin Blackwell     Date of Birth: Dec 29, 1950      Age:  71 y.o.    Chief Complaint   No chief complaint on file.         History Obtained From   patient    History of Present Illness   Erin Blackwell is a 71 year old  female who presents today for EGD and colonoscopy to evaluate her chronic ulcerative colitis and gastroesophageal reflux. She was diagnosed with ulcerative colitis back in the 1980s and has been on Sulfasalazine since that time. She is maintained on 3000mg  of Sulfasalazine daily and will experience diarrhea if she doesn't take this. She has been in clinical remission for at least several years and cannot remember when her last flare was. She normally has formed BMs and denies urgency, loose stools, tenesmus or rectal bleeding. She also has chronic GERD and reflux esophagitis and used to take Protonix regularly. She was taken off of her PPI last fall and takes otc Pepcid daily which works relatively well for her. She denies dysphagia or vomiting.   Her mother died of colon cancer at 49. Her husband suffered an iatrogenic perforation during colonoscopy in 2021 in the 2022 and ultimately passed from complications of this in January 2022.      Past Medical History     Past Medical History:   Diagnosis Date    Hyperlipidemia     Hypertension     Screening for colon cancer     Ulcerative colitis (HCC)     Wears eyeglasses         Past Surgical History     Past Surgical History:   Procedure Laterality Date    ABDOMINOPLASTY      BREAST ENHANCEMENT SURGERY      CESAREAN SECTION      COLONOSCOPY      ENDOSCOPY, COLON, DIAGNOSTIC      FOOT SURGERY Bilateral         Medications Prior to Admission     Prior to Admission medications    Medication Sig Start Date End Date Taking? Authorizing Provider   folic acid (FOLVITE) 1 MG tablet Take 1 mg by mouth every evening 12/06/98   Historical Provider, MD   rosuvastatin (CRESTOR) 10 MG tablet Take 10 mg by  mouth every evening 12/06/98   Historical Provider, MD   spironolactone (ALDACTONE) 50 MG tablet Take 50 mg by mouth every evening 08/06/20   Historical Provider, MD   sulfaSALAzine (AZULFIDINE) 500 MG tablet Take 500 mg by mouth every evening 04/05/1985   Historical Provider, MD        Allergies   Patient has no known allergies.    Social History     Social History       Tobacco History       Smoking Status  Never      Smokeless Tobacco Use  Never              Alcohol History       Alcohol Use Status  Yes Drinks/Week  7 Glasses of wine, 7 Cans of beer per week Amount  14.0 standard drinks of alcohol/wk Comment  socially              Drug Use       Drug Use Status  Never              Sexual Activity       Sexually Active  Not Asked                    Family History     Family History   Problem Relation Age of Onset    Colon Cancer Mother        Review of Systems   Review of Systems   Constitutional: Negative.    HENT: Negative.     Eyes: Negative.    Respiratory: Negative.     Cardiovascular: Negative.    Endocrine: Negative.    Genitourinary: Negative.    Musculoskeletal: Negative.    Allergic/Immunologic: Negative.    Neurological: Negative.    Hematological: Negative.      Physical Exam   BP (!) 164/80    Pulse 75    Temp 98.2 ??F (36.8 ??C)    Resp 17    Ht 5\' 1"  (1.549 m)    Wt 157 lb 13.6 oz (71.6 kg)    SpO2 96%    BMI 29.83 kg/m??     Physical Exam  Constitutional:       Appearance: Normal appearance.   HENT:      Head: Normocephalic and atraumatic.      Nose: Nose normal.      Mouth/Throat:      Mouth: Mucous membranes are moist.   Eyes:      Extraocular Movements: Extraocular movements intact.      Conjunctiva/sclera: Conjunctivae normal.      Pupils: Pupils are equal, round, and reactive to light.   Cardiovascular:      Rate and Rhythm: Normal rate and regular rhythm.      Pulses: Normal pulses.      Heart sounds: Normal heart sounds.   Pulmonary:      Effort: Pulmonary effort is normal.      Breath sounds: Normal  breath sounds.   Abdominal:      General: Abdomen is flat. Bowel sounds are normal.      Palpations: Abdomen is soft.   Musculoskeletal:         General: Normal range of motion.      Cervical back: Normal range of motion.   Skin:     General: Skin is warm and dry.   Neurological:      General: No focal deficit present.      Mental Status: She is alert. Mental status is at baseline.   Psychiatric:         Mood and Affect: Mood normal.       Labs    No results found for this or any previous visit (from the past 24 hour(s)).     Imaging/Diagnostics Last 24 Hours   No results found.    Assessment        Plan   EGD and colonoscopy     Consultations Ordered:  None    Electronically signed by , DO on 02/15/22 at 2:02 PM EDT

## 2022-02-15 NOTE — Anesthesia Pre-Procedure Evaluation (Addendum)
Department of Anesthesiology  Preprocedure Note       Name:  Erin Blackwell   Age:  71 y.o.  DOB:  05/24/1951                                          MRN:  [id number]         Date:  02/15/2022      Surgeon: Moishe SpiceSurgeon(s):  Juliette MangleJohn Mark Litchfield, DO    Procedure: Procedure(s):  EGD ESOPHAGOGASTRODUODENOSCOPY  COLONOSCOPY DIAGNOSTIC    Medications prior to admission:   Prior to Admission medications    Medication Sig Start Date End Date Taking? Authorizing Provider   folic acid (FOLVITE) 1 MG tablet Take 1 mg by mouth every evening 12/06/98   Historical Provider, MD   rosuvastatin (CRESTOR) 10 MG tablet Take 10 mg by mouth every evening 12/06/98   Historical Provider, MD   spironolactone (ALDACTONE) 50 MG tablet Take 50 mg by mouth every evening 08/06/20   Historical Provider, MD   sulfaSALAzine (AZULFIDINE) 500 MG tablet Take 500 mg by mouth every evening 04/05/1985   Historical Provider, MD       Current medications:    No current facility-administered medications for this encounter.     Current Outpatient Medications   Medication Sig Dispense Refill   ??? folic acid (FOLVITE) 1 MG tablet Take 1 mg by mouth every evening     ??? rosuvastatin (CRESTOR) 10 MG tablet Take 10 mg by mouth every evening     ??? spironolactone (ALDACTONE) 50 MG tablet Take 50 mg by mouth every evening     ??? sulfaSALAzine (AZULFIDINE) 500 MG tablet Take 500 mg by mouth every evening         Allergies:  No Known Allergies    Problem List:    Patient Active Problem List   Diagnosis Code   ??? Screening mammogram for breast cancer Z12.31   ??? Ulcerative colitis without complications (HCC) K51.90   ??? Primary hypertension I10   ??? Gastroesophageal reflux disease without esophagitis K21.9   ??? Hyperlipidemia E78.5   ??? Chronic pain of right knee M25.561, G89.29   ??? Asymptomatic menopausal state  Z78.0       Past Medical History:        Diagnosis Date   ??? Hyperlipidemia    ??? Hypertension    ??? Screening for colon cancer    ??? Ulcerative colitis (HCC)    ??? Wears eyeglasses         Past Surgical History:        Procedure Laterality Date   ??? ABDOMINOPLASTY     ??? BREAST ENHANCEMENT SURGERY     ??? CESAREAN SECTION     ??? COLONOSCOPY     ??? ENDOSCOPY, COLON, DIAGNOSTIC     ??? FOOT SURGERY Bilateral        Social History:    Social History     Tobacco Use   ??? Smoking status: Never   ??? Smokeless tobacco: Never   Substance Use Topics   ??? Alcohol use: Yes     Alcohol/week: 14.0 standard drinks     Types: 7 Glasses of wine, 7 Cans of beer per week     Comment: socially  Counseling given: Not Answered      Vital Signs (Current):   Vitals:    02/10/22 1127   Weight: 160 lb (72.6 kg)   Height: 5\' 1"  (1.549 m)                                              BP Readings from Last 3 Encounters:   01/08/22 124/72   12/10/21 126/82       NPO Status:                                                                                 BMI:   Wt Readings from Last 3 Encounters:   01/08/22 162 lb 8 oz (73.7 kg)   12/10/21 160 lb 9.6 oz (72.8 kg)     Body mass index is 30.23 kg/m??.    CBC: No results found for: WBC, RBC, HGB, HCT, MCV, RDW, PLT    CMP: No results found for: NA, K, CL, CO2, BUN, CREATININE, GFRAA, AGRATIO, LABGLOM, GLUCOSE, GLU, PROT, CALCIUM, BILITOT, ALKPHOS, AST, ALT    POC Tests: No results for input(s): POCGLU, POCNA, POCK, POCCL, POCBUN, POCHEMO, POCHCT in the last 72 hours.    Coags: No results found for: PROTIME, INR, APTT    HCG (If Applicable): No results found for: PREGTESTUR, PREGSERUM, HCG, HCGQUANT     ABGs: No results found for: PHART, PO2ART, PCO2ART, HCO3ART, BEART, O2SATART     Type & Screen (If Applicable):  No results found for: LABABO, LABRH    Drug/Infectious Status (If Applicable):  No results found for: HIV, HEPCAB    COVID-19 Screening (If Applicable):   Lab Results   Component Value Date/Time    COVID19 Detected 12/10/2021 09:10 AM           Anesthesia Evaluation  Patient summary reviewed and Nursing notes reviewed   no history of anesthetic  complications:   Airway: Mallampati: II  TM distance: >3 FB   Neck ROM: full  Mouth opening: > = 3 FB   Dental: normal exam         Pulmonary:Negative Pulmonary ROS and normal exam  breath sounds clear to auscultation                             Cardiovascular:  Exercise tolerance: good (>4 METS)  (+) hypertension:, hyperlipidemia        Rhythm: regular  Rate: normal           Beta Blocker:  Not on Beta Blocker         Neuro/Psych:   Negative Neuro/Psych ROS              GI/Hepatic/Renal:   (+) GERD:, PUD          Endo/Other: Negative Endo/Other ROS                    Abdominal:             Vascular: negative vascular ROS.  Other Findings:           Anesthesia Plan      general and TIVA     ASA 2       Induction: intravenous.      Anesthetic plan and risks discussed with patient.      Plan discussed with CRNA.    Attending anesthesiologist reviewed and agrees with Preprocedure content              Archie Balboa, MD   02/15/2022

## 2022-02-15 NOTE — Op Note (Signed)
Procedure: EGD with cold biopsy forceps     Endoscopist: Dr. Joslyn Hy    Date of procedure: 02/15/22     Indication: Gastroesophageal reflux disease     Post-operative diagnosis:  1. Medium sized sliding hiatal hernia  2. Patent Schatzki ring at the GE junction at 34cm  3. Somewhat atrophic appearing duodenal mucosa   4. Otherwise normal upper endoscopy     Consent: Informed consent was obtained after explaining the risks, indications, benefits and alternatives. Risks including bleeding, perforation, aspiration pneumonia, adverse event from sedation, and cardiorespiratory complications were discussed with the patient. An opportunity for questions was provided, and appropriate answers were provided.    Procedure: The patient was placed in the left lateral decubitus position. Time-out was performed. A bite-block was inserted, and sedation was achieved with the use of medications administered by anesthesia. A well lubricated Olympus gastroscope was advanced via the oropharynx into the esophagus and stomach. The scope was passed through the pylorus into the duodenal bulb and second portion of the duodenum. The gastroscope was then withdrawn into the stomach which was viewed in both forward and retroflexed view. The following findings were noted:    Duodenum: atrophic and edematous mucosa in the visualized duodenum. Biopsies were obtained from the second portion of the duodenum and duodenal bulb to rule out celiac sprue.   Stomach: Normal gastric mucosa; biopsies were obtained using a cold biopsy forceps to rule out H pylori gastritis  Esophagus: Medium sized 3cm sliding hiatal hernia with patent Schatzki ring at the GE junction located at 34cm. The esophageal mucosa was normal appearing.     The patient tolerated the procedure well. EKG and vital signs were monitored throughout the procedure. There were no immediate complications.    Blood loss: less than 2cc.    Specimens: duodenum, gastric biopsies     Plan  1.  Resume regular diet  2. Continue Famotidine as needed for gastroesophageal reflux disease  3. Await pathology results  4. Follow up in 4 weeks

## 2022-03-11 ENCOUNTER — Encounter

## 2022-03-11 NOTE — Telephone Encounter (Signed)
From: [name]  To: Dr. Wynonia Lawman  Sent: 03/11/2022 5:29 AM EDT  Subject: Refill my prescriptions     I need to refill all of my prescriptions with the exception of Sulfasalazine. I would like to receive by mail order with a 60-month supply. Under the refill section in Messages, it does not allow me to do this?    How do I need to proceed?  Erin Blackwell

## 2022-03-12 MED ORDER — FOLIC ACID 1 MG PO TABS
1 MG | ORAL_TABLET | Freq: Every evening | ORAL | 4 refills | Status: AC
Start: 2022-03-12 — End: 2023-04-05

## 2022-03-12 MED ORDER — ROSUVASTATIN CALCIUM 10 MG PO TABS
10 MG | ORAL_TABLET | Freq: Every evening | ORAL | 4 refills | Status: AC
Start: 2022-03-12 — End: 2023-06-02

## 2022-03-12 MED ORDER — SPIRONOLACTONE 50 MG PO TABS
50 MG | ORAL_TABLET | Freq: Every evening | ORAL | 4 refills | Status: AC
Start: 2022-03-12 — End: 2023-03-31

## 2022-03-12 MED ORDER — SULFASALAZINE 500 MG PO TABS
500 MG | ORAL_TABLET | Freq: Every evening | ORAL | 4 refills | Status: DC
Start: 2022-03-12 — End: 2022-03-15

## 2022-03-15 ENCOUNTER — Encounter

## 2022-03-15 ENCOUNTER — Ambulatory Visit: Payer: MEDICARE | Primary: Family Medicine

## 2022-03-15 MED ORDER — VALACYCLOVIR HCL 1 G PO TABS
1 g | ORAL_TABLET | Freq: Every day | ORAL | 0 refills | Status: AC
Start: 2022-03-15 — End: 2022-04-14

## 2022-03-15 MED ORDER — SULFASALAZINE 500 MG PO TABS
500 MG | ORAL_TABLET | Freq: Three times a day (TID) | ORAL | 0 refills | Status: DC
Start: 2022-03-15 — End: 2022-07-06

## 2022-03-15 NOTE — Telephone Encounter (Signed)
Are you ok with sending in this rx as 4 tabs 3xday.

## 2022-03-15 NOTE — Telephone Encounter (Signed)
From: Allen Kell [place]  To: Dr. Wynonia Lawman  Sent: 03/15/2022 9:56 AM EDT  Subject: Valacyclovir HCL    I occasionally have fever blisters on my lips and my previous PCP ordered Valacyclovir HCL 1 Gram Tablet so I can take whenever they first appear.    If I could get a 60-month supply, that would be helpful so I don't have to request so often.    Thanks, Erin Blackwell

## 2022-03-15 NOTE — Addendum Note (Signed)
Addended by: Harrel Lemon on: 03/15/2022 04:12 PM     Modules accepted: Orders

## 2022-04-06 ENCOUNTER — Encounter

## 2022-04-09 NOTE — Telephone Encounter (Signed)
Pt scheduled for 09/27/2022

## 2022-07-06 ENCOUNTER — Ambulatory Visit: Admit: 2022-07-06 | Discharge: 2022-07-06 | Payer: MEDICARE | Attending: Family Medicine | Primary: Family Medicine

## 2022-07-06 DIAGNOSIS — K5190 Ulcerative colitis, unspecified, without complications: Secondary | ICD-10-CM

## 2022-07-06 MED ORDER — SULFASALAZINE 500 MG PO TABS
500 MG | ORAL_TABLET | Freq: Three times a day (TID) | ORAL | 4 refills | Status: AC
Start: 2022-07-06 — End: 2023-11-11

## 2022-07-06 NOTE — Assessment & Plan Note (Signed)
Patient did establish with gastroenterology Dr. Jackey Loge, notes reviewed recommended continuing sulfasalazine at 3000 mg daily.  Patient will alternately increase her sulfasalazine depending on her symptoms.  Will prescribe at a higher dose to accommodate for this.

## 2022-07-06 NOTE — Progress Notes (Signed)
Erin Blackwell (DOB:  1951/06/24) is a 71 y.o. female here for evaluation of the following chief complaint(s):  Annual Exam    SUBJECTIVE:  HPI  71 year old female seen in clinic for follow-up.  She needs a refill of her sulfasalazine.  She also would like routine lab work to be ordered prior to her Medicare annual wellness visit in September.  She states she has her mammogram scheduled for October 2023.    Review of Systems  See HPI for details    Past Medical History:   Diagnosis Date    Hyperlipidemia     Hypertension     Screening for colon cancer     Ulcerative colitis (HCC)     Wears eyeglasses       Family History   Problem Relation Age of Onset    Colon Cancer Mother       Social History     Socioeconomic History    Marital status: Widowed     Spouse name: Not on file    Number of children: Not on file    Years of education: Not on file    Highest education level: Not on file   Occupational History    Not on file   Tobacco Use    Smoking status: Never    Smokeless tobacco: Never   Vaping Use    Vaping Use: Never used   Substance and Sexual Activity    Alcohol use: Yes     Alcohol/week: 14.0 standard drinks     Types: 7 Glasses of wine, 7 Cans of beer per week     Comment: socially    Drug use: Never    Sexual activity: Not on file   Other Topics Concern    Not on file   Social History Narrative    Not on file     Social Determinants of Health     Financial Resource Strain: Not on file   Food Insecurity: Not on file   Transportation Needs: Not on file   Physical Activity: Not on file   Stress: Not on file   Social Connections: Not on file   Intimate Partner Violence: Not on file   Housing Stability: Not on file      Past Surgical History:   Procedure Laterality Date    ABDOMINOPLASTY      BREAST ENHANCEMENT SURGERY      CESAREAN SECTION      COLONOSCOPY      COLONOSCOPY N/A 02/15/2022    COLONOSCOPY POLYPECTOMY SNARE/COLD BIOPSY performed by Juliette Mangle, DO at [facility] ENDOSCOPY    COLONOSCOPY  02/15/2022     COLONOSCOPY WITH BIOPSY performed by Juliette Mangle, DO at [facility] ENDOSCOPY    ENDOSCOPY, COLON, DIAGNOSTIC      FOOT SURGERY Bilateral     UPPER GASTROINTESTINAL ENDOSCOPY N/A 02/15/2022    EGD BIOPSY performed by Juliette Mangle, DO at [facility] ENDOSCOPY        OBJECTIVE:  Physical Exam  BP 110/72   Pulse 67   Ht 5\' 1"  (1.549 m)   Wt 156 lb 9.6 oz (71 kg)   SpO2 95%   BMI 29.59 kg/m   General Appearance:  Well appearing, developed and nourished.  Appears stated age.  No acute distress.  Head:  Normocephalic, atraumatic.  Nose:  Nares patent, no lesions.  Skin:  No rashes noted.  Musculoskeletal:  Grossly full range of motion, no swelling or deformity.  Neurologic:  Grossly  non focal.  Psych:  Cognitive function intact.  Judgement and insight normal.  Mood and affect appropriate.    No results found for any visits on 07/06/22.  ASSESSMENT/PLAN:  1. Ulcerative colitis without complications, unspecified location [facility])  Assessment & Plan:  Patient did establish with gastroenterology Dr. Jackey Loge, notes reviewed recommended continuing sulfasalazine at 3000 mg daily.  Patient will alternately increase her sulfasalazine depending on her symptoms.  Will prescribe at a higher dose to accommodate for this.   Orders:  -     sulfaSALAzine (AZULFIDINE) 500 MG tablet; Take 4 tablets by mouth 3 times daily, Disp-1080 tablet, R-4Normal  -     Comprehensive Metabolic Panel; Future  2. Hyperlipidemia, unspecified hyperlipidemia type  Assessment & Plan:  Noted to have elevated lipid panel in the past.  Due for Medicare annual wellness visit in September, will order repeat lipid panel to review at that time.   Orders:  -     Lipid Panel; Future      No follow-ups on file.    An electronic signature was used to authenticate this note.  Wynonia Lawman, DO

## 2022-07-06 NOTE — Assessment & Plan Note (Signed)
Noted to have elevated lipid panel in the past.  Due for Medicare annual wellness visit in September, will order repeat lipid panel to review at that time.

## 2022-07-08 ENCOUNTER — Encounter: Attending: Family Medicine | Primary: Family Medicine

## 2022-08-16 ENCOUNTER — Encounter: Attending: Family Medicine | Primary: Family Medicine

## 2022-08-25 ENCOUNTER — Encounter: Payer: MEDICARE | Attending: Family Medicine | Primary: Family Medicine

## 2022-08-31 ENCOUNTER — Ambulatory Visit: Payer: MEDICARE | Primary: Family Medicine

## 2022-09-27 ENCOUNTER — Inpatient Hospital Stay: Admit: 2022-09-27 | Payer: MEDICARE | Primary: Family Medicine

## 2022-09-27 ENCOUNTER — Encounter

## 2022-09-27 ENCOUNTER — Inpatient Hospital Stay: Admit: 2022-09-27 | Discharge: 2022-09-27 | Payer: MEDICARE | Primary: Family Medicine

## 2022-09-27 DIAGNOSIS — K5190 Ulcerative colitis, unspecified, without complications: Secondary | ICD-10-CM

## 2022-09-27 DIAGNOSIS — Z1231 Encounter for screening mammogram for malignant neoplasm of breast: Secondary | ICD-10-CM

## 2022-09-27 LAB — COMPREHENSIVE METABOLIC PANEL
ALT: 20 U/L (ref 0–35)
AST: 31 U/L (ref 0–35)
Albumin/Globulin Ratio: 2 (ref 1.00–2.70)
Albumin: 4.4 g/dL (ref 3.5–5.2)
Alk Phosphatase: 91 U/L (ref 35–117)
Anion Gap: 8 mmol/L (ref 2–17)
BUN: 11 mg/dL (ref 8–23)
CO2: 24 mmol/L (ref 22–29)
Calcium: 9.5 mg/dL (ref 8.8–10.2)
Chloride: 106 mmol/L (ref 98–107)
Creatinine: 0.5 mg/dL (ref 0.5–1.0)
Est, Glom Filt Rate: 101 mL/min/1.73m (ref >=60)
Globulin: 2.2 g/dL (ref 1.9–4.4)
Glucose: 112 mg/dL — ABNORMAL HIGH (ref 70–99)
OSMOLALITY CALCULATED: 276 mOsm/kg (ref 270–287)
Potassium: 4.1 mmol/L (ref 3.5–5.3)
Sodium: 138 mmol/L (ref 135–145)
Total Bilirubin: 0.48 mg/dL (ref 0.00–1.20)
Total Protein: 6.6 g/dL (ref 6.4–8.3)

## 2022-09-27 LAB — LIPID PANEL
Chol/HDL Ratio: 1.9 (ref 0.0–4.4)
Cholesterol: 169 mg/dL (ref 100–200)
HDL: 87 mg/dL (ref >=50)
LDL Cholesterol: 67 mg/dL (ref 0.0–100.0)
LDL/HDL Ratio: 1
Triglycerides: 76 mg/dL (ref 0–149)
VLDL: 15.2 mg/dL (ref 5.0–40.0)

## 2022-11-17 ENCOUNTER — Ambulatory Visit: Admit: 2022-11-17 | Discharge: 2022-11-17 | Payer: MEDICARE | Attending: Family Medicine | Primary: Family Medicine

## 2022-11-17 DIAGNOSIS — Z0000 Encounter for general adult medical examination without abnormal findings: Secondary | ICD-10-CM

## 2022-11-17 NOTE — Assessment & Plan Note (Signed)
Blood pressure 120/78 in clinic today.  Stable and well-controlled, continue spironolactone 50 mg.  Repeat metabolic panel in 1 year

## 2022-11-17 NOTE — Patient Instructions (Signed)
Starting a Weight Loss Plan: Care Instructions  Overview     If you're thinking about losing weight, it can be hard to know where to start. Your doctor can help you set up a weight loss plan that best meets your needs. You may want to take a class on nutrition or exercise, or you could join a weight loss support group. If you have questions about how to make changes to your eating or exercise habits, ask your doctor about seeing a registered dietitian or an exercise specialist.  It can be a big challenge to lose weight. But you don't have to make huge changes at once. Make small changes, and stick with them. When those changes become habit, add a few more changes.  If you don't think you're ready to make changes right now, try to pick a date in the future. Make an appointment to see your doctor to discuss whether the time is right for you to start a plan.  Follow-up care is a key part of your treatment and safety. Be sure to make and go to all appointments, and call your doctor if you are having problems. It's also a good idea to know your test results and keep a list of the medicines you take.  How can you care for yourself at home?  Set realistic goals. Many people expect to lose much more weight than is likely. A weight loss of 5% to 10% of your body weight may be enough to improve your health.  Get family and friends involved to provide support. Talk to them about why you are trying to lose weight, and ask them to help. They can help by participating in exercise and having meals with you, even if they may be eating something different.  Find what works best for you. If you do not have time or do not like to [place], a program that offers meal replacement bars or shakes may be better for you. Or if you like to prepare meals, finding a plan that includes daily menus and recipes may be best.  Ask your doctor about other health professionals who can help you achieve your weight loss goals.  A dietitian can help  you make healthy changes in your diet.  An exercise specialist or personal trainer can help you develop a safe and effective exercise program.  A counselor or psychiatrist can help you cope with issues such as depression, anxiety, or family problems that can make it hard to focus on weight loss.  Consider joining a support group for people who are trying to lose weight. Your doctor can suggest groups in your area.  Where can you learn more?  Go to https://www.bennett.info/ and enter U357 to learn more about "Starting a Weight Loss Plan: Care Instructions."  Current as of: September 20, 2023Content Version: 13.9   2006-2023 Health[employer], [employer].   Care instructions adapted under license by [facility] If you have questions about a medical condition or this instruction, always ask your healthcare professional. [place] any warranty or liability for your use of this information.           A Healthy Heart: Care Instructions  Your Care Instructions     Coronary artery disease, also called heart disease, occurs when a substance called plaque builds up in the vessels that supply oxygen-rich blood to your heart muscle. This can narrow the blood vessels and reduce blood flow. A heart attack happens when blood flow is completely blocked. A  high-fat diet, smoking, and other factors increase the risk of heart disease.  Your doctor has found that you have a chance of having heart disease. You can do lots of things to keep your heart healthy. It may not be easy, but you can change your diet, exercise more, and quit smoking. These steps really work to lower your chance of heart disease.  Follow-up care is a key part of your treatment and safety. Be sure to make and go to all appointments, and call your doctor if you are having problems. It's also a good idea to know your test results and keep a list of the medicines you take.  How can you care for yourself at  home?  Diet   Use less salt when you cook and eat. This helps lower your blood pressure. Taste food before salting. Add only a little salt when you think you need it. With time, your taste buds will adjust to less salt.    Eat fewer snack items, fast foods, canned soups, and other high-salt, high-fat, processed foods.    Read food labels and try to avoid saturated and trans fats. They increase your risk of heart disease by raising cholesterol levels.    Limit the amount of solid fat-butter, margarine, and shortening-you eat. Use olive, peanut, or canola oil when you cook. Bake, broil, and steam foods instead of frying them.    Eat a variety of fruit and vegetables every day. Dark green, deep orange, red, or yellow fruits and vegetables are especially good for you. Examples include spinach, carrots, peaches, and berries.    Foods high in fiber can reduce your cholesterol and provide important vitamins and minerals. High-fiber foods include whole-grain cereals and breads, oatmeal, beans, brown rice, citrus fruits, and apples.    Eat lean proteins. Heart-healthy proteins include seafood, lean meats and poultry, eggs, beans, peas, nuts, seeds, and soy products.    Limit drinks and foods with added sugar. These include candy, desserts, and soda pop.   Lifestyle changes   If your doctor recommends it, get more exercise. Walking is a good choice. Bit by bit, increase the amount you walk every day. Try for at least 30 minutes on most days of the week. You also may want to swim, bike, or do other activities.    Do not smoke. If you need help quitting, talk to your doctor about stop-smoking programs and medicines. These can increase your chances of quitting for good. Quitting smoking may be the most important step you can take to protect your heart. It is never too late to quit.    Limit alcohol to 2 drinks a day for men and 1 drink a day for women. Too much alcohol can cause health problems.    Manage other  health problems such as diabetes, high blood pressure, and high cholesterol. If you think you may have a problem with alcohol or drug use, talk to your doctor.   Medicines   Take your medicines exactly as prescribed. Call your doctor if you think you are having a problem with your medicine.    If your doctor recommends aspirin, take the amount directed each day. Make sure you take aspirin and not another kind of pain reliever, such as acetaminophen (Tylenol).   When should you call for help?   Call 911 if you have symptoms of a heart attack. These may include:   Chest pain or pressure, or a strange feeling in the chest.  Sweating.    Shortness of breath.    Pain, pressure, or a strange feeling in the back, neck, jaw, or upper belly or in one or both shoulders or arms.    Lightheadedness or sudden weakness.    A fast or irregular heartbeat.   After you call 911, the operator may tell you to chew 1 adult-strength or 2 to 4 low-dose aspirin. Wait for an ambulance. Do not try to drive yourself.  Watch closely for changes in your health, and be sure to contact your doctor if you have any problems.  Where can you learn more?  Go to https://www.bennett.info/ and enter F075 to learn more about "A Healthy Heart: Care Instructions."  Current as of: June 25, 2023Content Version: 13.9   2006-2023 Healthwise, Incorporated.   Care instructions adapted under license by [facility]. If you have questions about a medical condition or this instruction, always ask your healthcare professional. [place] any warranty or liability for your use of this information.      Personalized Preventive Plan for Erin Blackwell - 11/17/2022  Medicare offers a range of preventive health benefits. Some of the tests and screenings are paid in full while other may be subject to a deductible, co-insurance, and/or copay.    Some of these benefits include a comprehensive review of your medical  history including lifestyle, illnesses that may run in your family, and various assessments and screenings as appropriate.    After reviewing your medical record and screening and assessments performed today your provider may have ordered immunizations, labs, imaging, and/or referrals for you.  A list of these orders (if applicable) as well as your Preventive Care list are included within your After Visit Summary for your review.    Other Preventive Recommendations:    A preventive eye exam performed by an eye specialist is recommended every 1-2 years to screen for glaucoma; cataracts, macular degeneration, and other eye disorders.  A preventive dental visit is recommended every 6 months.  Try to get at least 150 minutes of exercise per week or 10,000 steps per day on a pedometer .  Order or download the FREE "Exercise & Physical Activity: Your Everyday Guide" from [employer]. Call (412)882-1001 or search [employer] online.  You need 1200-1500 mg of calcium and 1000-2000 IU of vitamin D per day. It is possible to meet your calcium requirement with diet alone, but a vitamin D supplement is usually necessary to meet this goal.  When exposed to the sun, use a sunscreen that protects against both UVA and UVB radiation with an SPF of 30 or greater. Reapply every 2 to 3 hours or after sweating, drying off with a towel, or swimming.  Always wear a seat belt when traveling in a car. Always wear a helmet when riding a bicycle or motorcycle.

## 2022-11-17 NOTE — Progress Notes (Signed)
Medicare Annual Wellness Visit    Erin Blackwell is here for Medicare AWV    Assessment & Plan   Initial Medicare annual wellness visit  Assessment & Plan:  Medicare annual wellness visit completed.  Past medical history medication list updated and reviewed with patient.  She is up-to-date on colonoscopy and mammogram.  Due for flu vaccine, given in clinic today.  Discussed RSV vaccine with patient but hesitant at this time, she will contact us or her local pharmacy if she would like to complete.  Follow-up in 1 year or sooner as needed.   Elevated glucose  Assessment & Plan:  Previous lab work showed elevated glucose.  Will repeat in 1 year with A1c   Orders:  -     Hemoglobin A1C; Future  Primary hypertension  Assessment & Plan:  Blood pressure 120/78 in clinic today.  Stable and well-controlled, continue spironolactone 50 mg.  Repeat metabolic panel in 1 year   Orders:  -     Cbc With Auto Differential; Future  Hyperlipidemia, unspecified hyperlipidemia type  Assessment & Plan:  Previous lipid panel reviewed, at goal.  Repeat in 1 year, continue rosuvastatin 10 mg   Orders:  -     Lipid Panel; Future  -     Comprehensive Metabolic Panel; Future  Need for vaccination  -     Influenza, FLUZONE HIGH-DOSE, (age 31 y+), IM, PF, 0.7 mL  Recommendations for Preventive Services Due: see orders and patient instructions/AVS.  Recommended screening schedule for the next 5-10 years is provided to the patient in written form: see Patient Instructions/AVS.     Return in about 1 year (around 11/18/2023) for medicare AWV.     Subjective   The following acute and/or chronic problems were also addressed today:  71 year old female seen in clinic for Medicare annual wellness visit.  Like to discuss preventative medicine.  States that she has been very active, playing pickle ball and doing line dancing.    Patient's complete Health Risk Assessment and screening values have been reviewed and are found in Flowsheets. The following  problems were reviewed today and where indicated follow up appointments were made and/or referrals ordered.    Positive Risk Factor Screenings with Interventions:                 Weight and Activity:  Physical Activity: Sufficiently Active (11/14/2022)    Exercise Vital Sign     Days of Exercise per Week: 4 days     Minutes of Exercise per Session: 90 min     On average, how many days per week do you engage in moderate to strenuous exercise (like a brisk walk)?: 4 days  Have you lost any weight without trying in the past 3 months?: No  Body mass index is 30.46 kg/m. (!) Abnormal  Obesity Interventions:  Is very active, plays pickle ball several days a week and does line dancing on other days.  She has struggled to lose weight all of her life but remains active.                               Objective   Vitals:    11/17/22 1105   BP: 120/78   Pulse: 66   SpO2: 95%   Weight: 73.1 kg (161 lb 3.2 oz)   Height: 1.549 m (5\' 1" )      Body mass index is 30.46 kg/m.  General Appearance: alert and oriented to person, place and time, well-developed and well-nourished, in no acute distress  Skin: warm and dry, no rash or erythema  Head: normocephalic and atraumatic  Extremities: no cyanosis and no clubbing  Musculoskeletal: normal range of motion, no joint swelling, deformity or tenderness  Neurologic: gait and coordination normal and speech normal       No Known Allergies  Prior to Visit Medications    Medication Sig Taking? Authorizing Provider   sulfaSALAzine (AZULFIDINE) 500 MG tablet Take 4 tablets by mouth 3 times daily Yes Javae Braaten, DO   famotidine (PEPCID) 40 MG tablet Take 1 tablet by mouth every morning Yes [provider]   folic acid (FOLVITE) 1 MG tablet Take 1 tablet by mouth every evening Yes Josetta Wigal, DO   rosuvastatin (CRESTOR) 10 MG tablet Take 1 tablet by mouth every evening Yes Summerlyn Fickel, DO   spironolactone (ALDACTONE) 50 MG tablet Take 1 tablet by mouth every evening  Yes Jassiah Viviano, DO       CareTeam (Including outside providers/suppliers regularly involved in providing care):   Patient Care Team:  Nanetta Batty, DO as PCP - General (Family Medicine)  Nanetta Batty, DO as PCP - Empaneled Provider  Laury Axon, DO as Surgeon (Gastroenterology)     Reviewed and updated this visit:  Tobacco  Allergies  Meds  Problems  Med Hx  Surg Hx  Soc Hx  Fam Hx

## 2022-11-17 NOTE — Assessment & Plan Note (Signed)
Previous lipid panel reviewed, at goal.  Repeat in 1 year, continue rosuvastatin 10 mg

## 2022-11-17 NOTE — Assessment & Plan Note (Signed)
Previous lab work showed elevated glucose.  Will repeat in 1 year with A1c

## 2022-11-17 NOTE — Assessment & Plan Note (Signed)
Medicare annual wellness visit completed.  Past medical history medication list updated and reviewed with patient.  She is up-to-date on colonoscopy and mammogram.  Due for flu vaccine, given in clinic today.  Discussed RSV vaccine with patient but hesitant at this time, she will contact us or her local pharmacy if she would like to complete.  Follow-up in 1 year or sooner as needed.

## 2022-12-07 ENCOUNTER — Ambulatory Visit: Admit: 2022-12-07 | Discharge: 2022-12-07 | Payer: MEDICARE | Attending: Physician Assistant | Primary: Family Medicine

## 2022-12-07 DIAGNOSIS — R051 Acute cough: Secondary | ICD-10-CM

## 2022-12-07 LAB — POC COVID-19 & INFLUENZA COMBO (LIAT IN HOUSE)
INFLUENZA A: NOT DETECTED
INFLUENZA B: NOT DETECTED
SARS-CoV-2: NOT DETECTED

## 2022-12-07 NOTE — Progress Notes (Signed)
ASSESSMENT/PLAN:     1. Acute cough  -     POC COVID-19 & Influenza Combo ([facility])  2. Low grade fever  3. Viral syndrome      Results for orders placed or performed in visit on 12/07/22   POC COVID-19 & Influenza Combo ([facility])   Result Value Ref Range    SARS-CoV-2 Not Detected Not Detected    INFLUENZA A Not Detected Not Detected    INFLUENZA B Not Detected Not Detected    Narrative    Is this test for diagnosis or screening?->Diagnosis of ill patient  Symptomatic for COVID-19 as defined by CDC?->Yes  Date of Symptom Onset->12/06/22  Hospitalized for COVID-19?->No  Admitted to ICU for COVID-19?->No  Employed in healthcare setting?->Unknown  Resident in a congregate (group) care setting?->Unknown  Pregnant?->No  Previously tested for COVID-19?->Unknown       Pt with h/o HTN, HLD, UC.  COVID and influenza testing negative today.  Most likely viral etiology.  Recommend supportive care, including rest, hydration, and over-the-counter medications as needed, including tylenol for fever/aches.  Discussed signs and symptoms to monitor.  Follow-up if no improvement/worsening symptoms.             CHIEF COMPLAINT:  Erin Blackwell (DOB:  Sep 02, 1951) is a 72 y.o. female, here for evaluation of the following chief complaint(s):  Cough (Sat/sun. Achy congestion she feels worse as time went on. )      HPI:  Erin Blackwell is a 72 year old female with a history of UC, hypertension, hyperlipidemia presenting today with a cough, congestion, low-grade temperature, and bodyaches that started 2 to 3 days ago.  She has a slight sore throat.  She denies any wheezing or shortness of breath.  No known sick contacts.  Has been taking over-the-counter cough medicine.  She has been vaccinated for flu and COVID.      Cough  Associated symptoms include a fever, headaches, myalgias and a sore throat. Pertinent negatives include no chest pain, chills, shortness of breath or wheezing.       CURRENT MEDICATIONS:  No outpatient medications  have been marked as taking for the 12/07/22 encounter (Office Visit) with Zenaida Deed, PA.       REVIEW OF SYSTEMS:  Review of Systems   Constitutional:  Positive for fever. Negative for chills.   HENT:  Positive for congestion and sore throat.    Respiratory:  Positive for cough. Negative for shortness of breath and wheezing.    Cardiovascular:  Negative for chest pain.   Musculoskeletal:  Positive for myalgias.   Neurological:  Positive for headaches. Negative for dizziness.       ALLERGIES:  No Known Allergies     PAST MEDICAL HISTORY:  Past Medical History:   Diagnosis Date    Hyperlipidemia     Hypertension     Screening for colon cancer     Ulcerative colitis ([place])     Wears eyeglasses        PAST SURGICAL HISTORY:  Past Surgical History:   Procedure Laterality Date    ABDOMINOPLASTY      BREAST ENHANCEMENT SURGERY  1995    CESAREAN SECTION      COLONOSCOPY      COLONOSCOPY N/A 02/15/2022    COLONOSCOPY POLYPECTOMY SNARE/COLD BIOPSY performed by Laury Axon, DO at [facility] ENDOSCOPY    COLONOSCOPY  02/15/2022    COLONOSCOPY WITH BIOPSY performed by Laury Axon, DO at [facility] ENDOSCOPY  ENDOSCOPY, COLON, DIAGNOSTIC      FOOT SURGERY Bilateral     UPPER GASTROINTESTINAL ENDOSCOPY N/A 02/15/2022    EGD BIOPSY performed by Laury Axon, DO at [facility] ENDOSCOPY       FAMILY HISTORY:  Family History   Problem Relation Age of Onset    Colon Cancer Mother        TOBACCO HISTORY:  Social History     Tobacco Use   Smoking Status Never   Smokeless Tobacco Never       SOCIAL HISTORY:  Social History     Socioeconomic History    Marital status: Widowed     Spouse name: None    Number of children: None    Years of education: None    Highest education level: None   Tobacco Use    Smoking status: Never    Smokeless tobacco: Never   Vaping Use    Vaping Use: Never used   Substance and Sexual Activity    Alcohol use: Yes     Alcohol/week: 14.0 standard drinks of alcohol     Types: 7 Glasses of wine, 7 Cans of  beer per week     Comment: socially    Drug use: Never     Social Determinants of Health     Physical Activity: Sufficiently Active (11/14/2022)    Exercise Vital Sign     Days of Exercise per Week: 4 days     Minutes of Exercise per Session: 90 min       OB HISTORY:  OB History   Gravida Para Term Preterm AB Living   2 2 2          SAB IAB Ectopic Molar Multiple Live Births             2      # Outcome Date GA Lbr Len/2nd Weight Sex Delivery Anes PTL Lv   2 Term            1 Term                VITAL SIGNS:  Vitals:    12/07/22 0959   BP: 116/72   Pulse: 88   Resp: 16   Temp: 99.6 F (37.6 C)   SpO2: 97%   Weight: 71 kg (156 lb 9.6 oz)   Height: 1.549 m (5\' 1" )       PHYSICAL EXAM:  Physical Exam  Vitals reviewed.   Constitutional:       General: She is not in acute distress.     Appearance: She is not toxic-appearing.   HENT:      Head: Normocephalic and atraumatic.      Right Ear: Tympanic membrane normal.      Left Ear: Tympanic membrane normal.      Mouth/Throat:      Pharynx: No oropharyngeal exudate or posterior oropharyngeal erythema.   Eyes:      Extraocular Movements: Extraocular movements intact.      Conjunctiva/sclera: Conjunctivae normal.   Cardiovascular:      Rate and Rhythm: Normal rate and regular rhythm.      Heart sounds: No murmur heard.     No friction rub. No gallop.   Pulmonary:      Effort: Pulmonary effort is normal. No respiratory distress.      Breath sounds: Normal breath sounds. No wheezing or rhonchi.   Skin:     General: Skin is warm and dry.   Neurological:  General: No focal deficit present.      Mental Status: She is alert and oriented to person, place, and time.   Psychiatric:         Mood and Affect: Mood normal.         Behavior: Behavior normal.                An electronic signature was used to authenticate this note.    --Zenaida Deed, PA

## 2022-12-10 ENCOUNTER — Encounter

## 2022-12-10 MED ORDER — PSEUDOEPH-BROMPHEN-DM 30-2-10 MG/5ML PO SYRP
2-30-10 MG/5ML | Freq: Four times a day (QID) | ORAL | 0 refills | Status: AC | PRN
Start: 2022-12-10 — End: 2022-12-15

## 2022-12-10 NOTE — Telephone Encounter (Signed)
From: Erin Blackwell  To: Dr. Nanetta Batty  Sent: 12/10/2022 8:06 AM EST  Subject: Sore throat    I visited [facility] urgent care on Tuesday am because of flu like symptoms. I tested negative for both Covid & the flu. Unfortunately I am still feeling lethargic and have a terrible sore throat. I have constant drainage at night which causes me to cough up phlegm. The body aches have subsided, but I still feel bad. Could you perhaps prescribe a cough syrup with codene or something else to help with my recovery? Let me know if I should come in to be Re-checked? I need some relief and assistance. Thank you.

## 2022-12-12 ENCOUNTER — Ambulatory Visit: Admit: 2022-12-12 | Discharge: 2022-12-12 | Payer: MEDICARE | Attending: Family | Primary: Family Medicine

## 2022-12-12 DIAGNOSIS — J029 Acute pharyngitis, unspecified: Secondary | ICD-10-CM

## 2022-12-12 LAB — AMB POC RAPID STREP A: Group A Strep Antigen, POC: NEGATIVE

## 2022-12-12 MED ORDER — AMOXICILLIN 500 MG PO CAPS
500 MG | ORAL_CAPSULE | Freq: Two times a day (BID) | ORAL | 0 refills | Status: AC
Start: 2022-12-12 — End: 2022-12-22

## 2022-12-12 MED ORDER — PREDNISONE 20 MG PO TABS
20 MG | ORAL_TABLET | Freq: Two times a day (BID) | ORAL | 0 refills | Status: AC
Start: 2022-12-12 — End: 2022-12-17

## 2022-12-12 NOTE — Progress Notes (Signed)
Erin Blackwell (DOB:  1951-07-16) is a 72 y.o. female, here for evaluation of the following chief complaint(s):  Chief Complaint   Patient presents with    Cough     Seen last week- not any better- throat is raw- cough- nasal drainage       SUBJECTIVE/OBJECTIVE:    HPI:    Presents with sinus congestion, sore throat and cough which started about a week ago.  She was seen on 1/2 and tested for COVID and flu.  Her test results were negative at that visit.  States she continues to have some sinus congestion but her main concern is her sore throat.  States throat is painful with swallowing.  She denies fever.  States she has been taking over-the-counter medication with minimal relief.  States she has also tried cough drops, throat lozenges and salt water gargles with minimal relief.     Current Outpatient Medications on File Prior to Visit   Medication Sig Dispense Refill    brompheniramine-pseudoephedrine-DM 2-30-10 MG/5ML syrup Take 5 mLs by mouth 4 times daily as needed for Cough or Congestion 100 mL 0    sulfaSALAzine (AZULFIDINE) 500 MG tablet Take 4 tablets by mouth 3 times daily 1080 tablet 4    famotidine (PEPCID) 40 MG tablet Take 1 tablet by mouth every morning      folic acid (FOLVITE) 1 MG tablet Take 1 tablet by mouth every evening 90 tablet 4    rosuvastatin (CRESTOR) 10 MG tablet Take 1 tablet by mouth every evening 90 tablet 4    spironolactone (ALDACTONE) 50 MG tablet Take 1 tablet by mouth every evening 90 tablet 4     No current facility-administered medications on file prior to visit.      No Known Allergies      Past Medical History:   Diagnosis Date    Hyperlipidemia     Hypertension     Lab test positive for detection of COVID-19 virus 12/10/2021    Screening for colon cancer     Ulcerative colitis ([place])     Wears eyeglasses       Family History   Problem Relation Age of Onset    Colon Cancer Mother       Social History     Socioeconomic History    Marital status: Widowed     Spouse name: None     Number of children: None    Years of education: None    Highest education level: None   Tobacco Use    Smoking status: Never    Smokeless tobacco: Never   Vaping Use    Vaping Use: Never used   Substance and Sexual Activity    Alcohol use: Yes     Alcohol/week: 14.0 standard drinks of alcohol     Types: 7 Glasses of wine, 7 Cans of beer per week     Comment: socially    Drug use: Never     Social Determinants of Health     Physical Activity: Sufficiently Active (11/14/2022)    Exercise Vital Sign     Days of Exercise per Week: 4 days     Minutes of Exercise per Session: 90 min      Review of Systems   Constitutional: Negative.  Negative for fever.   HENT:  Positive for congestion and sore throat.    Respiratory:  Positive for cough.    Cardiovascular: Negative.    Skin: Negative.    All other  systems reviewed and are negative.          VITALS    BP (!) 151/85   Pulse 88   Temp 98.4 F (36.9 C)   Resp 16   Ht 1.549 m (5\' 1" )   Wt 71.7 kg (158 lb)   SpO2 96%   BMI 29.85 kg/m     Physical Exam  Constitutional:       General: She is not in acute distress.     Appearance: She is well-developed.   HENT:      Right Ear: Tympanic membrane normal.      Left Ear: Tympanic membrane normal.      Nose: Congestion present.      Mouth/Throat:      Mouth: Mucous membranes are moist.      Pharynx: Posterior oropharyngeal erythema present.      Comments: PND  Cardiovascular:      Rate and Rhythm: Normal rate and regular rhythm.   Pulmonary:      Effort: Pulmonary effort is normal.      Breath sounds: Normal breath sounds.      Comments: Bronchospastic cough  Lymphadenopathy:      Cervical: No cervical adenopathy.   Skin:     General: Skin is warm and dry.   Neurological:      Mental Status: She is alert.             ASSESSMENT/PLAN:  1. Sore throat  -     AMB POC RAPID STREP A  -     Culture, Throat; Future  2. Post-nasal drainage  3. Acute cough  4. Acute pharyngitis, unspecified etiology  -     amoxicillin (AMOXIL) 500 MG  capsule; Take 2 capsules by mouth 2 times daily for 10 days, Disp-40 capsule, R-0Normal  -     predniSONE (DELTASONE) 20 MG tablet; Take 1 tablet by mouth 2 times daily for 5 days, Disp-10 tablet, R-0Normal       Rapid strep test is negative in clinic.  Throat culture is pending.  Based on exam, will start antibiotics for acute pharyngitis and respiratory infection.  Advised to take until course is complete.  Recommend that she repeat place toothbrush on day 3 of treatment to avoid potential reinfection if any strep bacteria is present on culture.         Results for orders placed or performed in visit on 12/12/22   AMB POC RAPID STREP A   Result Value Ref Range    Valid Internal Control, POC pass     Group A Strep Antigen, POC Negative            FOLLOWUP  Return if symptoms worsen or fail to improve.              An electronic signature was used to authenticate this note.    --Cathren Harsh, APRN - NP

## 2022-12-13 LAB — CULTURE, THROAT: FINAL REPORT: NORMAL

## 2023-01-20 ENCOUNTER — Encounter

## 2023-01-20 MED ORDER — VALACYCLOVIR HCL 1 G PO TABS
1 g | ORAL_TABLET | ORAL | 0 refills | Status: AC
Start: 2023-01-20 — End: 2023-06-02

## 2023-01-20 NOTE — Telephone Encounter (Signed)
From: Belinda Fisher [place]lnutt  To: Dr. Nanetta Batty  Sent: 01/20/2023 10:20 AM EST  Subject: Valacyclovir HCL 1 Gram    Could I please get a prescription for the above for a fever blister?   [facility]

## 2023-01-20 NOTE — Telephone Encounter (Signed)
Med not on med list, pt states she's been on medication for years due to blisters

## 2023-03-31 ENCOUNTER — Encounter

## 2023-03-31 MED ORDER — FAMOTIDINE 40 MG PO TABS
40 | ORAL_TABLET | Freq: Every morning | ORAL | 3 refills | Status: DC
Start: 2023-03-31 — End: 2024-09-11

## 2023-03-31 MED ORDER — SPIRONOLACTONE 50 MG PO TABS
50 MG | ORAL_TABLET | Freq: Every evening | ORAL | 1 refills | Status: DC
Start: 2023-03-31 — End: 2023-11-11

## 2023-03-31 NOTE — Telephone Encounter (Signed)
Folic acid  No protocol for this dx/med.    Aldactone  Criteria reviewed and met, escribed per physician ordered protocol

## 2023-03-31 NOTE — Telephone Encounter (Signed)
From: Allen Kell Bora  To: Dr. Wynonia Lawman  Sent: 03/31/2023 1:55 PM EDT  Subject: Famotidine 40 mg Prescription 90 day    Would the Dr please prescribe the above prescription for me for 90 day refills. [facility]   Thanks, Catalyna Reilly

## 2023-03-31 NOTE — Telephone Encounter (Signed)
Rx pend

## 2023-04-05 NOTE — Telephone Encounter (Signed)
Medication and/or diagnosis excluded from protocol, sent to office to fill

## 2023-04-05 NOTE — Telephone Encounter (Signed)
Rx pend

## 2023-04-06 MED ORDER — FOLIC ACID 1 MG PO TABS
1 | ORAL_TABLET | Freq: Every evening | ORAL | 4 refills | Status: AC
Start: 2023-04-06 — End: ?

## 2023-04-17 ENCOUNTER — Encounter

## 2023-06-02 ENCOUNTER — Encounter

## 2023-06-02 MED ORDER — VALACYCLOVIR HCL 1 G PO TABS
1 | ORAL_TABLET | ORAL | 0 refills | Status: AC
Start: 2023-06-02 — End: ?

## 2023-06-02 MED ORDER — ROSUVASTATIN CALCIUM 10 MG PO TABS
10 MG | ORAL_TABLET | Freq: Every evening | ORAL | 1 refills | Status: AC
Start: 2023-06-02 — End: 2023-11-22

## 2023-06-02 NOTE — Telephone Encounter (Signed)
Rx pend

## 2023-06-02 NOTE — Telephone Encounter (Signed)
Per protocol criteria met for refill. Refill sent per physician ordered protocol. See signed orders.    VALTREX - Medication and/or diagnosis excluded from protocol, sent to office to fill

## 2023-09-02 ENCOUNTER — Encounter: Attending: Family Medicine | Primary: Family Medicine

## 2023-09-29 ENCOUNTER — Encounter

## 2023-11-07 ENCOUNTER — Encounter: Admit: 2023-11-07 | Payer: MEDICARE | Primary: Family Medicine

## 2023-11-07 VITALS — Ht 61.0 in | Wt 160.0 lb

## 2023-11-07 DIAGNOSIS — M7711 Lateral epicondylitis, right elbow: Secondary | ICD-10-CM

## 2023-11-07 NOTE — Progress Notes (Signed)
 Hand Surgery History and Physical      Date:11/07/2023        Patient Name:Erin Blackwell     Date of Birth:12-15-1950     Age:72 y.o.    Chief Complaint     Chief Complaint   Patient presents with    New Patient     NP-RIGHT ELBOW PAIN/ NO PREV TX /NOT WC /

## 2023-11-11 ENCOUNTER — Encounter: Admit: 2023-11-11 | Admitting: Family Medicine

## 2023-11-11 ENCOUNTER — Encounter
Admit: 2023-11-11 | Discharge: 2023-11-12 | Disposition: A | Discharge status: Home / Self Care | Payer: MEDICARE | Source: Ambulatory Visit | Primary: Family Medicine

## 2023-11-11 DIAGNOSIS — M7711 Lateral epicondylitis, right elbow: Secondary | ICD-10-CM

## 2023-11-11 DIAGNOSIS — K5190 Ulcerative colitis, unspecified, without complications: Secondary | ICD-10-CM

## 2023-11-11 MED ORDER — SPIRONOLACTONE 50 MG PO TABS
50 | ORAL_TABLET | Freq: Every evening | ORAL | 1 refills | Status: AC
Start: 2023-11-11 — End: ?

## 2023-11-11 MED ORDER — SULFASALAZINE 500 MG PO TABS
500 | ORAL_TABLET | Freq: Three times a day (TID) | ORAL | 1 refills | Status: DC
Start: 2023-11-11 — End: 2024-11-22

## 2023-11-11 NOTE — Telephone Encounter (Signed)
 Rx pend

## 2023-11-11 NOTE — Other (Signed)
 [facility]  [address]  [place] [address]  Phone: [phone]  Fax: [phone]  Outpatient Occupational Therapy Evaluation  Episodes    Erin Blackwell   (72 y.o. female)  DOB [birth year]    Medical & Insurance Info [place]

## 2023-11-11 NOTE — Telephone Encounter (Signed)
 SULFASALAZINE - Medication and/or diagnosis excluded from protocol, sent to office to fill    SPIRONOLACTONE - Per protocol criteria not met. Labs  > 6 months. Sent to practice for provider discretion.

## 2023-11-16 ENCOUNTER — Encounter
Admit: 2023-11-16 | Discharge: 2023-11-17 | Disposition: A | Discharge status: Home / Self Care | Payer: MEDICARE | Source: Home / Self Care | Primary: Family Medicine

## 2023-11-16 ENCOUNTER — Encounter: Admit: 2023-11-16 | Admitting: Family Medicine

## 2023-11-16 DIAGNOSIS — M7711 Lateral epicondylitis, right elbow: Secondary | ICD-10-CM

## 2023-11-16 DIAGNOSIS — E785 Hyperlipidemia, unspecified: Secondary | ICD-10-CM

## 2023-11-16 LAB — LIPID PANEL
Chol/HDL Ratio: 2.3 (ref 0.0–4.4)
Cholesterol, Total: 177 mg/dL (ref 100–200)
HDL: 76 mg/dL (ref >=50)
LDL Cholesterol: 70 mg/dL (ref 0.0–100.0)
LDL/HDL Ratio: 0.9
Triglycerides: 155 mg/dL — ABNORMAL HIGH (ref 0–149)
VLDL: 31 mg/dL (ref 5.0–40.0)

## 2023-11-16 LAB — CBC WITH AUTO DIFFERENTIAL
Basophils %: 0.6 % (ref 0.0–2.0)
Basophils Absolute: 0 10*3/uL (ref 0.0–0.2)
Eosinophils %: 0.8 % (ref 0.0–7.0)
Eosinophils Absolute: 0 10*3/uL (ref 0.0–0.5)
Hematocrit: 36.5 % (ref 34.0–47.0)
Hemoglobin: 11.9 g/dL (ref 11.5–15.7)
Immature Grans (Abs): 0.03 10*3/uL (ref 0.00–0.06)
Immature Granulocytes %: 0.6 % (ref 0.0–0.6)
Lymphocytes Absolute: 0.5 10*3/uL — ABNORMAL LOW (ref 1.0–3.2)
Lymphocytes: 9.9 % — ABNORMAL LOW (ref 15.0–45.0)
MCH: 31.4 pg (ref 27.0–34.5)
MCHC: 32.6 g/dL (ref 30.0–36.0)
MCV: 96.3 fL (ref 81.0–99.0)
MPV: 10.4 fL (ref 7.0–12.2)
Monocytes %: 8.5 % (ref 4.0–12.0)
Monocytes Absolute: 0.4 10*3/uL (ref 0.3–1.0)
NRBC Absolute: 0 10*3/uL (ref 0.000–0.012)
NRBC Automated: 0 % (ref 0.0–0.2)
Neutrophils %: 79.6 % — ABNORMAL HIGH (ref 42.0–74.0)
Neutrophils Absolute: 3.9 10*3/uL (ref 1.6–7.3)
Platelets: 172 10*3/uL (ref 140–440)
RBC: 3.79 x10e6/mcL (ref 3.60–5.20)
RDW: 14.1 % (ref 10.0–17.0)
WBC: 4.9 10*3/uL (ref 3.8–10.6)

## 2023-11-16 LAB — HEMOGLOBIN A1C
Estimated Avg Glucose: 76
Estimated Avg Glucose: 77
Hemoglobin A1C: 4.3 % (ref 4.0–6.0)

## 2023-11-16 LAB — COMPREHENSIVE METABOLIC PANEL
ALT: 19 U/L (ref 0–42)
AST: 33 U/L (ref 0–46)
Albumin/Globulin Ratio: 1.9 (ref 1.00–2.70)
Albumin: 4.7 g/dL (ref 3.5–5.2)
Alk Phosphatase: 87 U/L (ref 35–117)
Anion Gap: 13 mmol/L (ref 2–17)
BUN: 7 mg/dL — ABNORMAL LOW (ref 8–23)
CO2: 24 mmol/L (ref 22–29)
Calcium: 10 mg/dL (ref 8.5–10.7)
Chloride: 107 mmol/L (ref 98–107)
Creatinine: 0.6 mg/dL (ref 0.5–1.0)
Est, Glom Filt Rate: 96 mL/min/1.73mÂ² (ref >=60)
Globulin: 2.5 g/dL (ref 1.9–4.4)
Glucose: 101 mg/dL — ABNORMAL HIGH (ref 70–99)
Osmolaliy Calculated: 285 mosm/kg (ref 270–287)
Potassium: 3.7 mmol/L (ref 3.5–5.3)
Sodium: 144 mmol/L (ref 135–145)
Total Bilirubin: 0.61 mg/dL (ref 0.00–1.20)
Total Protein: 7.2 g/dL (ref 5.7–8.3)

## 2023-11-16 NOTE — Progress Notes (Addendum)
 [facility]  [address]  [place] [address]  Phone: [phone]  Fax: [phone]  Outpatient Occupational Therapy Treatment Episodes    Erin Blackwell   (72 y.o. female)  DOB [birth year]    Medical & Insurance Info [facility]

## 2023-11-18 ENCOUNTER — Encounter

## 2023-11-18 ENCOUNTER — Encounter: Payer: MEDICARE | Primary: Family Medicine

## 2023-11-21 ENCOUNTER — Inpatient Hospital Stay
Admit: 2023-11-21 | Disposition: A | Discharge status: Home / Self Care | Payer: MEDICARE | Source: Home / Self Care | Primary: Family Medicine

## 2023-11-21 DIAGNOSIS — M7711 Lateral epicondylitis, right elbow: Secondary | ICD-10-CM

## 2023-11-21 NOTE — Progress Notes (Signed)
 [facility]  [address]  [place] [address]  Phone: [phone]  Fax: [phone]  Outpatient Occupational Therapy Treatment Episodes    Erin Blackwell   (72 y.o. female)  DOB [birth year]    Medical & Insurance Info [facility]

## 2023-11-22 ENCOUNTER — Ambulatory Visit
Admit: 2023-11-22 | Discharge: 2023-11-22 | Payer: MEDICARE | Attending: Family Medicine | Admitting: Family Medicine | Primary: Family Medicine

## 2023-11-22 ENCOUNTER — Encounter: Admit: 2023-11-22 | Admitting: Family Medicine

## 2023-11-22 VITALS — BP 132/80 | HR 71 | Ht 61.0 in | Wt 159.6 lb

## 2023-11-22 DIAGNOSIS — Z1231 Encounter for screening mammogram for malignant neoplasm of breast: Secondary | ICD-10-CM

## 2023-11-22 DIAGNOSIS — Z0000 Encounter for general adult medical examination without abnormal findings: Principal | ICD-10-CM

## 2023-11-22 MED ORDER — ROSUVASTATIN CALCIUM 10 MG PO TABS
10 | ORAL_TABLET | Freq: Every evening | ORAL | 1 refills | Status: AC
Start: 2023-11-22 — End: ?

## 2023-11-22 NOTE — Patient Instructions (Signed)
 Starting a Weight Loss Plan: Care Instructions  Overview     It can be a challenge to lose weight. But your doctor can help you make a weight-loss plan that meets your needs.  You don't have to make a lot of big changes at once. A better idea might be

## 2023-11-22 NOTE — Assessment & Plan Note (Addendum)
 Medicare annual wellness visit completed.  Past medical history and medication list updated and reviewed with patient.  Cognitive screening completed with patient.  Due for mammogram and DEXA scan, ordered today.  Follow-up in 1 year or sooner as needed.

## 2023-11-22 NOTE — Assessment & Plan Note (Signed)
 Lipid panel at goal.  Continue rosuvastatin 10 mg.  Repeat lipid panel in 1 year

## 2023-11-22 NOTE — Assessment & Plan Note (Signed)
 Blood pressure 132/80 in clinic today.  Stable and well-controlled on spironolactone 50 mg, continue medication at current dose and repeat metabolic panel in 1 year

## 2023-11-22 NOTE — Progress Notes (Signed)
 Medicare Annual Wellness Visit    Erin Blackwell is here for Medicare AWV    Assessment & Plan   Medicare annual wellness visit, subsequent  Assessment & Plan:  Medicare annual wellness visit completed.  Past medical history and medication list updated and

## 2023-11-24 ENCOUNTER — Inpatient Hospital Stay
Admit: 2023-11-24 | Disposition: A | Discharge status: Home / Self Care | Payer: MEDICARE | Source: Home / Self Care | Primary: Family Medicine

## 2023-11-24 DIAGNOSIS — M7711 Lateral epicondylitis, right elbow: Principal | ICD-10-CM

## 2023-11-24 NOTE — Progress Notes (Signed)
 [facility]  [address]  [place] [address]  Phone: [phone]  Fax: [phone]  Outpatient Occupational Therapy Treatment Episodes    Erin Blackwell   (72 y.o. female)  DOB [birth year]    Medical & Insurance Info [facility]

## 2023-11-28 ENCOUNTER — Inpatient Hospital Stay
Admit: 2023-11-28 | Disposition: A | Discharge status: Home / Self Care | Payer: Medicare (Managed Care) | Source: Home / Self Care | Primary: Family Medicine

## 2023-11-28 DIAGNOSIS — M7711 Lateral epicondylitis, right elbow: Secondary | ICD-10-CM

## 2023-11-28 NOTE — Progress Notes (Signed)
 [facility]  [address]  [place] [address]  Phone: [phone]  Fax: [phone]  Outpatient Occupational Therapy Treatment Episodes    Erin Blackwell   (72 y.o. female)  DOB [birth year]    Medical & Insurance Info [facility]

## 2023-12-05 ENCOUNTER — Inpatient Hospital Stay
Admit: 2023-12-05 | Disposition: A | Discharge status: Home / Self Care | Payer: Medicare (Managed Care) | Source: Home / Self Care | Primary: Family Medicine

## 2023-12-05 DIAGNOSIS — M7711 Lateral epicondylitis, right elbow: Principal | ICD-10-CM

## 2023-12-05 NOTE — Progress Notes (Signed)
 [facility]  [address]  [place] [address]  Phone: [phone]  Fax: [phone]  Outpatient Occupational Therapy Discharge Summary  Episodes    Erin Blackwell   (72 y.o. female)  DOB 11/01/1951    Medical & Insurance

## 2023-12-09 NOTE — Telephone Encounter (Signed)
 [facility] mammogram have been requested Florie confirmation received

## 2023-12-20 ENCOUNTER — Encounter: Payer: MEDICARE | Primary: Family Medicine

## 2024-02-21 ENCOUNTER — Encounter

## 2024-02-21 MED ORDER — ROSUVASTATIN CALCIUM 10 MG PO TABS
10 | ORAL_TABLET | Freq: Every evening | ORAL | 4 refills | Status: DC
Start: 2024-02-21 — End: 2024-11-22

## 2024-02-21 MED ORDER — FOLIC ACID 1 MG PO TABS
1 | ORAL_TABLET | Freq: Every evening | ORAL | 4 refills | Status: DC
Start: 2024-02-21 — End: 2024-11-22

## 2024-02-21 MED ORDER — SPIRONOLACTONE 50 MG PO TABS
50 | ORAL_TABLET | Freq: Every evening | ORAL | 4 refills | 60.00000 days | Status: DC
Start: 2024-02-21 — End: 2024-11-22

## 2024-02-21 NOTE — Telephone Encounter (Signed)
 Rx pend

## 2024-03-07 ENCOUNTER — Ambulatory Visit: Payer: MEDICARE | Primary: Family Medicine

## 2024-03-30 ENCOUNTER — Encounter

## 2024-05-07 NOTE — Progress Notes (Signed)
 Pre Procedure Patient Instructions     Procedure Location hospital:[facility]: [address] [place] - Drop off at Outpatient Services to the left of the main hospital entrance and proceed to the gold elevator on your left (past the security desk) to the 2nd floor Surgery Reception desk.   Procedure Date: 05/21/24  Arrival Time: 0600 AM    Your doctor determines your scheduled start time.  Depending on the facility where your procedure is taking place and the scheduled start time, you may be required to arrive up to 2 hours prior.  This is to allow time for registration, your preop assessment, any day of procedure testing and to meet with your care teams members.  This also allows your procedure to be performed earlier should there be a cancellation that day.  We appreciate your patience as we work to provide you with excellent service.    Medications: Follow the medication instructions below to prevent your procedure from being cancelled.    Continue taking all medications reviewed with the preadmission nurse EXCEPT follow the instructions below.  Stop all supplements, vitamins and herbal remedies one week prior to your procedure: LAST DOSE: 05/13/24  EXCEPT continue taking FOLIC ACID .    Do not take over the counter pain medications except plain Tylenol or Acetaminophen for seven days prior to your procedure unless your doctor told you otherwise.    If you are taking any diabetes and or weight loss medications (including injectables and shots) not discussed during the call with the PreAdmission Nurse, it is important to call [phone] to discuss important instructions.    If you are taking blood thinners, and have not been told when to hold the dose, call the doctor performing your procedure for instructions on when to stop.    On the day of your procedure, a nurse will review your medications and ask when you last took each one.        Procedure Preparation    Diet  Restrictions:Follow the prep instructions from your doctor.      If you have questions on the day of your procedure call [facility] Pre[facility] at [phone].    Skin Preparation:    Do not put on any deodorants, lotions, powders, or oils on the day of procedure.  Be sure to put on clean, comfortable loose fitting clothing.    Other Preparation:  Call your doctor right away if you have any fever, cold or flu symptoms  Bowel prep as instructed by your doctor    Complete the following tests/lab work, Potassium Level, for your procedure by 05/11/24 at [facility]: [address] 3rd floor [place]; M-F 7a-5p; Their phone number is [phone] if needed. You do not need to fast. Bring your picture ID and insurance card. Inform the receptionist you are there for testing before your surgery. Tell them the ordering provider is Dr Chester Costa. If you have any issue, call [phone]      If you have any scheduling concerns or procedure questions (including questions after your procedure) please contact your physician's office. Ceasar Codding, DO  Phone Number: [phone]     Day of Procedure Patient Instruction:  Do not smoke, vape, chew tobacco, drink alcohol or use recreational drugs on the day of your procedure  Remove all jewelry, piercings, and metal accessories  Do not wear artificial nails and only clear nail polish on natural nails. Nails must be  trimmed to fingertip length to make sure the oxygen probe fits properly and to avoid injury  Do not wear artificial eye lashes because they can cause dry eyes, eye scratches, eye infections and allergic reactions  Do not use hair extensions with metal clips or hairstyles near the back of your neck, as it can make it difficult to safely manage your breathing during anesthesia  If your hair is tightly braided or styled in a complex way, it may need to be undone for your safety  Do not wear contacts, tampons, make-up,  lotions, creams, powders, fragrances or deodorant  Wear loose fit clothing  Do not bring valuables or money  Bring a copy of your Living Will and/or Medical Durable Power of Attorney if you have one  Bring a list of current medications including name and dosage  Bring a picture ID and insurance card      If you are going home the same day as your procedure, a support person should accompany you to the facility and must transport you home.  If you plan to take public transportation of any sort, your support person must accompany you home.  You should have someone to stay with you for 24 hours after your procedure with sedation of any kind.     This information was reviewed with you during your Pre-Admission Testing interview and you verbalized understanding. If you have any additional questions please contact [phone].    To pre-register for your procedure OR for billing estimate questions please call [phone] Option 2 and then Option 3.    For financial questions AFTER your procedure is complete please contact [phone].    For financial questions regarding anesthesia at a [facility] facility, please  contact [phone].    For MyChart Patient Portal help please call [phone].    For hotel accommodations please visit [email or web address] and select PATIENTS &VISITORS -> "FOR VISITORS" section and then TRAVEL INFORMATION -> PLACES TO STAY

## 2024-05-11 LAB — POTASSIUM: Potassium: 4.1 mmol/L (ref 3.5–5.3)

## 2024-05-21 ENCOUNTER — Inpatient Hospital Stay: Payer: Medicare (Managed Care)

## 2024-05-21 MED ORDER — HYDRALAZINE HCL 20 MG/ML IJ SOLN
20 | INTRAMUSCULAR | Status: DC | PRN
Start: 2024-05-21 — End: 2024-05-21

## 2024-05-21 MED ORDER — ACETAMINOPHEN 325 MG PO TABS
325 | Freq: Once | ORAL | Status: DC | PRN
Start: 2024-05-21 — End: 2024-05-21

## 2024-05-21 MED ORDER — HYDROMORPHONE HCL 1 MG/ML IJ SOLN
1 | INTRAMUSCULAR | Status: DC | PRN
Start: 2024-05-21 — End: 2024-05-21

## 2024-05-21 MED ORDER — DIPHENHYDRAMINE HCL 50 MG/ML IJ SOLN
50 | Freq: Once | INTRAMUSCULAR | Status: DC | PRN
Start: 2024-05-21 — End: 2024-05-21

## 2024-05-21 MED ORDER — SODIUM CHLORIDE 0.9 % IV SOLN
0.9 | INTRAVENOUS | Status: DC | PRN
Start: 2024-05-21 — End: 2024-05-21

## 2024-05-21 MED ORDER — IPRATROPIUM-ALBUTEROL 0.5-2.5 (3) MG/3ML IN SOLN
0.5-2.5 | Freq: Once | RESPIRATORY_TRACT | Status: DC | PRN
Start: 2024-05-21 — End: 2024-05-21

## 2024-05-21 MED ORDER — LABETALOL HCL 5 MG/ML IV SOLN
5 | INTRAVENOUS | Status: DC | PRN
Start: 2024-05-21 — End: 2024-05-21

## 2024-05-21 MED ORDER — PROPOFOL 200 MG/20ML IV EMUL
200 | INTRAVENOUS | Status: AC
Start: 2024-05-21 — End: ?

## 2024-05-21 MED ORDER — SODIUM CHLORIDE 0.9 % IV SOLN
0.9 | INTRAVENOUS | Status: DC
Start: 2024-05-21 — End: 2024-05-21

## 2024-05-21 MED ORDER — LIDOCAINE HCL (PF) 2 % IJ SOLN
2 | Freq: Once | INTRAMUSCULAR | Status: DC | PRN
Start: 2024-05-21 — End: 2024-05-21
  Administered 2024-05-21: 12:00:00 40 via INTRAVENOUS

## 2024-05-21 MED ORDER — LIDOCAINE HCL (PF) 2 % IJ SOLN
2 | INTRAMUSCULAR | Status: AC
Start: 2024-05-21 — End: ?

## 2024-05-21 MED ORDER — NORMAL SALINE FLUSH 0.9 % IV SOLN
0.9 | Freq: Two times a day (BID) | INTRAVENOUS | Status: DC
Start: 2024-05-21 — End: 2024-05-21

## 2024-05-21 MED ORDER — SODIUM CHLORIDE (PF) 0.9 % IJ SOLN
0.9 | INTRAMUSCULAR | Status: DC | PRN
Start: 2024-05-21 — End: 2024-05-21

## 2024-05-21 MED ORDER — SODIUM CHLORIDE 0.9 % IV SOLN
0.9 | INTRAVENOUS | Status: DC | PRN
Start: 2024-05-21 — End: 2024-05-21
  Administered 2024-05-21: 12:00:00 via INTRAVENOUS

## 2024-05-21 MED ORDER — DROPERIDOL 2.5 MG/ML IJ SOLN
2.5 | Freq: Once | INTRAMUSCULAR | Status: DC | PRN
Start: 2024-05-21 — End: 2024-05-21

## 2024-05-21 MED ORDER — MEPERIDINE HCL 50 MG/ML IJ SOLN
50 | INTRAMUSCULAR | Status: DC | PRN
Start: 2024-05-21 — End: 2024-05-21

## 2024-05-21 MED ORDER — ONDANSETRON HCL 4 MG/2ML IJ SOLN
4 | Freq: Once | INTRAMUSCULAR | Status: DC | PRN
Start: 2024-05-21 — End: 2024-05-21

## 2024-05-21 MED ORDER — LIDOCAINE HCL (PF) 1 % IJ SOLN
1 | Freq: Once | INTRAMUSCULAR | Status: AC | PRN
Start: 2024-05-21 — End: 2024-05-21
  Administered 2024-05-21: 11:00:00 0.1 mL via INTRADERMAL

## 2024-05-21 MED ORDER — FENTANYL CITRATE (PF) 100 MCG/2ML IJ SOLN
100 | INTRAMUSCULAR | Status: DC | PRN
Start: 2024-05-21 — End: 2024-05-21

## 2024-05-21 MED ORDER — NORMAL SALINE FLUSH 0.9 % IV SOLN
0.9 | INTRAVENOUS | Status: DC | PRN
Start: 2024-05-21 — End: 2024-05-21

## 2024-05-21 MED ORDER — PROPOFOL 200 MG/20ML IV EMUL
200 | Freq: Once | INTRAVENOUS | Status: DC | PRN
Start: 2024-05-21 — End: 2024-05-21
  Administered 2024-05-21: 12:00:00 50 via INTRAVENOUS
  Administered 2024-05-21: 13:00:00 300 via INTRAVENOUS

## 2024-05-21 MED ORDER — LORAZEPAM 2 MG/ML IJ SOLN
2 | Freq: Once | INTRAMUSCULAR | Status: DC | PRN
Start: 2024-05-21 — End: 2024-05-21

## 2024-05-21 MED FILL — DIPRIVAN 200 MG/20ML IV EMUL: 200 MG/20ML | INTRAVENOUS | Qty: 20 | Fill #0

## 2024-05-21 MED FILL — XYLOCAINE-MPF 2 % IJ SOLN: 2 % | INTRAMUSCULAR | Qty: 5 | Fill #0

## 2024-05-21 MED FILL — XYLOCAINE-MPF 1 % IJ SOLN: 1 % | INTRAMUSCULAR | Qty: 2 | Fill #0

## 2024-05-21 NOTE — Discharge Instructions (Signed)
 1.  Resume regular diet  2.  Continue sulfasalazine  1000 mg twice daily  3.  Follow-up in the office in 6 months (11/2024)  4.  Repeat colonoscopy in 2 years (05/2026)

## 2024-05-21 NOTE — Anesthesia Post-Procedure Evaluation (Signed)
 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) [K51.919])      (Family history of malignant neoplasm of gastrointestinal tract [Z80.0])    Surgeons: Ceasar Codding, DO Responsible Provider: Jeneane Miracle, MD    Anesthesia Type: General, TIVA ASA Status: 3            Anesthesia Type: General, TIVA    Aldrete Phase I:      Aldrete Phase II: Aldrete Score: 10    Anesthesia Post Evaluation    Patient location during evaluation: PACU  Patient participation: complete - patient participated  Level of consciousness: awake  Pain score: 2  Airway patency: patent  Nausea & Vomiting: no vomiting  Cardiovascular status: hemodynamically stable  Respiratory status: acceptable  Hydration status: stable  Comments: Vss see pacu notes  Pain management: adequate    No notable events documented.

## 2024-05-21 NOTE — Op Note (Signed)
 Procedure: Colonoscopy with cold biopsy forceps and cold snare polypectomy    Endoscopist: Dr. Chester Costa    Date of procedure: 05/21/24     Indication: Chronic ulcerative colitis, family history of colon cancer    Post-operative diagnosis:  1.  Trace mucosal edema throughout the colon consistent with quiescent ulcerative colitis  2.  Sessile polyp in the splenic flexure  3.  Mucosal tattoo in the distal transverse colon    Consent: Informed consent was obtained after explaining the risks, indications, benefits and alternatives. Risks including bleeding, perforation, aspiration pneumonia, missed neoplasm, adverse event from sedation, and cardiorespiratory complications were discussed with the patient. An opportunity for questions was provided, and appropriate answers were provided.    Procedure: The patient was placed in the left lateral decubitus position. Time-out was performed. A digital rectal exam was performed and sedation was achieved with the use of medications administered by anesthesia. A well lubricated Olympus colonoscope was advanced via the anal canal into the rectum, sigmoid colon, descending colon, transverse colon, ascending colon and cecum. The cecum was identified by the appendiceal orifice and ileocecal valve. The colonoscope was then slowly withdrawn to the rectum where a retroflexion was performed. The following findings were noted:     Prep: good  Terminal ileum: Normal ileal mucosa  Cecum: Normal cecal mucosa  Ascending colon: Trace patchy mucosal edema.  Biopsies were obtained using a cold biopsy forceps  Hepatic flexure: Normal colonic mucosa  Transverse colon: Slight patchy mucosal edema throughout the transverse colon.  Large mucosal tattoo in the distal transverse colon.  Random colon biopsies were obtained  Splenic flexure: An 8 mm sessile polyp was removed using a cold snare.  An endoclip was deployed at the polypectomy site  Descending colon: Normal colonic mucosa  Sigmoid colon:  Normal colonic mucosa; random colon biopsies obtained using a cold biopsy forceps  Rectum: Normal rectal mucosa.  Random colon biopsies obtained using a cold biopsy forceps    The patient tolerated the procedure well. EKG and vital signs were monitored throughout the procedure. There were no immediate complications.    Blood loss: less than 2cc.    Specimens: Ascending colon, transverse colon, splenic flexure polyp, descending/sigmoid colon, rectum    Plan  1.  Resume regular diet  2.  Continue sulfasalazine  1000 mg twice daily  3.  Follow-up in the office in 6 months  4.  Repeat colonoscopy in 2 years

## 2024-05-21 NOTE — Anesthesia Pre-Procedure Evaluation (Signed)
 Department of Anesthesiology  Preprocedure Note       Name:  Erin Blackwell   Age:  73 y.o.  DOB:  [birth year]                                          MRN:  [id number]         Date:  05/21/2024      Surgeon: Kelby Patches):  Ceasar Codding, DO    Procedure: Procedure(s):  COLONOSCOPY DIAGNOSTIC    Medications prior to admission:   Prior to Admission medications    Medication Sig Start Date End Date Taking? Authorizing Provider   Multiple Vitamins-Minerals (DAILY MULTIVITAMIN PO) Take 1 tablet by mouth daily   Yes [provider]   Magnesium ([employer] TRIPLE MAGNESIUM COMPLEX) 400 MG CAPS Take 1 capsule by mouth daily   Yes [provider]   Ascorbic Acid (VITAMIN C) 1000 MG tablet Take 1 tablet by mouth daily   Yes [provider]   Omega-3 Fatty Acids (OMEGA-3 PO) Take 2 capsules by mouth daily   Yes [provider]   NONFORMULARY Take 1 capsule by mouth daily MultiHerbal Supplement   Yes [provider]   folic acid  (FOLVITE ) 1 MG tablet Take 1 tablet by mouth every evening 02/21/24  Yes McMonigle, Ryan, DO   rosuvastatin  (CRESTOR ) 10 MG tablet Take 1 tablet by mouth every evening 02/21/24  Yes McMonigle, Ryan, DO   spironolactone  (ALDACTONE ) 50 MG tablet Take 1 tablet by mouth every evening 02/21/24  Yes McMonigle, Ryan, DO   sulfaSALAzine  (AZULFIDINE ) 500 MG tablet Take 4 tablets by mouth 3 times daily  Patient taking differently: Take 4 tablets by mouth at bedtime 11/11/23  Yes Pippin, Alveda Aures, APRN - NP   valACYclovir  (VALTREX ) 1 g tablet Take 1 tablet by mouth daily for 5 days as needed for outbreaks  Patient taking differently: Take 1 tablet by mouth daily as needed (outbreak) Take 1 tablet by mouth daily for 5 days as needed for outbreaks 06/02/23  Yes McMonigle, Ryan, DO   famotidine  (PEPCID ) 40 MG tablet Take 1 tablet by mouth every morning  Patient not taking: Reported on 05/07/2024 03/31/23   Bishop Bullock, DO       Current medications:    Current Facility-Administered  Medications   Medication Dose Route Frequency Provider Last Rate Last Admin   . sodium chloride  flush 0.9 % injection 5-40 mL  5-40 mL IntraVENous 2 times per day Ceasar Codding, DO       . sodium chloride  flush 0.9 % injection 5-40 mL  5-40 mL IntraVENous PRN Ceasar Codding, DO       . 0.9 % sodium chloride  infusion  25 mL IntraVENous PRN Ceasar Codding, DO       . 0.9 % sodium chloride  infusion   IntraVENous Continuous Ceasar Codding, DO           Allergies:  No Known Allergies    Problem List:    Patient Active Problem List   Diagnosis Code   . Screening mammogram for breast cancer Z12.31   . Ulcerative colitis without complications (HCC) K51.90   . Primary hypertension I10   . Gastroesophageal reflux disease without esophagitis K21.9   . Hyperlipidemia E78.5   . Chronic pain of right knee M25.561, G89.29   . Asymptomatic menopausal state  Z78.0   . Elevated glucose R73.09       Past Medical History:        Diagnosis Date   . Hyperlipidemia    . Hypertension    . Lab test positive for detection of COVID-19 virus 12/10/2021   . Neuropathy 2021    after foot surgery   . Screening for colon cancer    . Ulcerative colitis (HCC)    . Wears eyeglasses        Past Surgical History:        Procedure Laterality Date   . ABDOMINOPLASTY     . BREAST ENHANCEMENT SURGERY  1995    since removed   . CESAREAN SECTION     . COLONOSCOPY     . COLONOSCOPY N/A 02/15/2022    COLONOSCOPY POLYPECTOMY SNARE/COLD BIOPSY performed by Ceasar Codding, DO at [facility] ENDOSCOPY   . COLONOSCOPY  02/15/2022    COLONOSCOPY WITH BIOPSY performed by Ceasar Codding, DO at [facility] ENDOSCOPY   . ENDOSCOPY, COLON, DIAGNOSTIC     . EYE SURGERY  cataracts   . FOOT SURGERY Bilateral    . UPPER GASTROINTESTINAL ENDOSCOPY N/A 02/15/2022    EGD BIOPSY performed by Ceasar Codding, DO at [facility] ENDOSCOPY       Social History:    Social History     Tobacco Use   . Smoking status: Never   . Smokeless tobacco: Never   Substance  Use Topics   . Alcohol use: Yes     Alcohol/week: 7.0 standard drinks of alcohol     Types: 2 Glasses of wine, 5 Cans of beer per week     Comment: Either 1 beer or 1 glass of wine daily                                Counseling given: Not Answered      Vital Signs (Current):   Vitals:    05/07/24 1536 05/21/24 0637   BP:  (!) 160/77   Pulse:  64   Resp:  16   Temp:  97.7 F (36.5 C)   TempSrc:  Oral   SpO2:  100%   Weight: 65.3 kg (144 lb) 64.4 kg (142 lb)   Height: 1.524 m (5') 1.537 m (5' 0.5)                                              BP Readings from Last 3 Encounters:   05/21/24 (!) 160/77   11/22/23 132/80   12/12/22 (!) 151/85       NPO Status: Time of last liquid consumption: 0300                        Time of last solid consumption: 2359                        Date of last liquid consumption: 05/21/24                        Date of last solid food consumption: 05/19/24    BMI:   Wt Readings from Last 3 Encounters:   05/21/24 64.4 kg (142 lb)   11/22/23 72.4 kg (159 lb  9.6 oz)   11/07/23 72.6 kg (160 lb)     Body mass index is 27.28 kg/m.    CBC:   Lab Results   Component Value Date/Time    WBC 4.9 11/16/2023 11:11 AM    RBC 3.79 11/16/2023 11:11 AM    HGB 11.9 11/16/2023 11:11 AM    HCT 36.5 11/16/2023 11:11 AM    MCV 96.3 11/16/2023 11:11 AM    RDW 14.1 11/16/2023 11:11 AM    PLT 172 11/16/2023 11:11 AM       CMP:   Lab Results   Component Value Date/Time    NA 144 11/16/2023 11:11 AM    K 4.1 05/11/2024 09:39 AM    CL 107 11/16/2023 11:11 AM    CO2 24 11/16/2023 11:11 AM    BUN 7 11/16/2023 11:11 AM    CREATININE 0.6 11/16/2023 11:11 AM    LABGLOM 96 11/16/2023 11:11 AM    LABGLOM 101 09/27/2022 08:02 AM    GLUCOSE 101 11/16/2023 11:11 AM    CALCIUM  10.0 11/16/2023 11:11 AM    BILITOT 0.61 11/16/2023 11:11 AM    ALKPHOS 87 11/16/2023 11:11 AM    AST 33 11/16/2023 11:11 AM    ALT 19 11/16/2023 11:11 AM       POC Tests: No results for input(s): POCGLU, POCNA, POCK, POCCL, POCBUN, POCHEMO,  POCHCT in the last 72 hours.    Coags: No results found for: PROTIME, INR, APTT    HCG (If Applicable): No results found for: PREGTESTUR, PREGSERUM, HCG, HCGQUANT     ABGs: No results found for: PHART, PO2ART, PCO2ART, HCO3ART, BEART, O2SATART     Type & Screen (If Applicable):  No results found for: ABORH, LABANTI    Drug/Infectious Status (If Applicable):  No results found for: HIV, HEPCAB    COVID-19 Screening (If Applicable):   Lab Results   Component Value Date/Time    COVID19 Not Detected 12/07/2022 10:06 AM           Anesthesia Evaluation  Patient summary reviewed and Nursing notes reviewed  Airway: Mallampati: II  TM distance: >3 FB        Dental: normal exam         Pulmonary:normal exam                               Cardiovascular:    (+) hypertension:, hyperlipidemia        Rhythm: regular  Rate: normal                    Neuro/Psych:               GI/Hepatic/Renal:   (+) GERD:, PUD         ROS comment: UC.   Endo/Other:                     Abdominal: normal exam            Vascular:          Other Findings:       Anesthesia Plan      general     ASA 3     (Sedation   mask)  Induction: intravenous.      Anesthetic plan and risks discussed with patient.      Plan discussed with CRNA.    Attending anesthesiologist reviewed and agrees with Preprocedure content  Jeneane Miracle, MD   05/21/2024

## 2024-05-21 NOTE — H&P (Signed)
 HISTORY AND PHYSICAL             Date: 05/21/2024        Patient Name: Erin Blackwell     Date of Birth: 1951-06-26      Age:  73 y.o.    Chief Complaint   Chronic ulcerative colitis, fam hx of colon cancer         History Obtained From   patient    History of Present Illness   Erin Blackwell is a 73 year old  female who presents today for colonoscopy to evaluate her chronic ulcerative colitis. She remains on Sulfasalazine  3000mg  daily and is doing well. She denies diarrhea, abdominal cramping, tenesmus, rectal bleeding or change in stools.     I performed on March 13th 2023 to evaluate her GERD and chronic ulcerative colitis. Her EGD revealed a hiatal hernia and patent Schatzki ring at the GE junction. Duodenal mucosa appeared somewhat atrophic however biopsies were negative for celiac sprue. Her colonoscopy revealed mucosal edema throughout the colon and a small non-neoplastic polyp in the transverse colon within an area of mucosa tattoo. She remains on Sulfasalazine  3gm daily and also takes Famotidine  as needed for her acid reflux.     She was diagnosed with ulcerative colitis back in the 1980s and has been on Sulfasalazine  since that time. She is maintained on 3000mg  of Sulfasalazine  daily and will experience diarrhea if she doesn't take this. She has been in clinical remission for at least several years and cannot remember when her last flare was.   Her mother died of colon cancer at 42. Her husband suffered an iatrogenic perforation during colonoscopy in 2021 in the [place] and ultimately passed from complications of this in January 2022.     Past Medical History     Past Medical History:   Diagnosis Date    Hyperlipidemia     Hypertension     Lab test positive for detection of COVID-19 virus 12/10/2021    Neuropathy 2021    after foot surgery    Screening for colon cancer     Ulcerative colitis [facility])     Wears eyeglasses         Past Surgical History     Past Surgical History:   Procedure Laterality Date     ABDOMINOPLASTY      BREAST ENHANCEMENT SURGERY  1995    since removed    CESAREAN SECTION      COLONOSCOPY      COLONOSCOPY N/A 02/15/2022    COLONOSCOPY POLYPECTOMY SNARE/COLD BIOPSY performed by Ceasar Codding, DO at [facility] ENDOSCOPY    COLONOSCOPY  02/15/2022    COLONOSCOPY WITH BIOPSY performed by Ceasar Codding, DO at [facility] ENDOSCOPY    ENDOSCOPY, COLON, DIAGNOSTIC      EYE SURGERY  cataracts    FOOT SURGERY Bilateral     UPPER GASTROINTESTINAL ENDOSCOPY N/A 02/15/2022    EGD BIOPSY performed by Ceasar Codding, DO at [facility] ENDOSCOPY        Medications Prior to Admission     Prior to Admission medications    Medication Sig Start Date End Date Taking? Authorizing Provider   Multiple Vitamins-Minerals (DAILY MULTIVITAMIN PO) Take 1 tablet by mouth daily   Yes [provider]   Magnesium ([employer]S TRIPLE MAGNESIUM COMPLEX) 400 MG CAPS Take 1 capsule by mouth daily   Yes [provider]   Ascorbic Acid (VITAMIN C) 1000 MG tablet Take 1 tablet by mouth  daily   Yes [provider]   Omega-3 Fatty Acids (OMEGA-3 PO) Take 2 capsules by mouth daily   Yes [provider]   NONFORMULARY Take 1 capsule by mouth daily MultiHerbal Supplement   Yes [provider]   folic acid  (FOLVITE ) 1 MG tablet Take 1 tablet by mouth every evening 02/21/24  Yes McMonigle, Ryan, DO   rosuvastatin  (CRESTOR ) 10 MG tablet Take 1 tablet by mouth every evening 02/21/24  Yes McMonigle, Ryan, DO   spironolactone  (ALDACTONE ) 50 MG tablet Take 1 tablet by mouth every evening 02/21/24  Yes McMonigle, Ryan, DO   sulfaSALAzine  (AZULFIDINE ) 500 MG tablet Take 4 tablets by mouth 3 times daily  Patient taking differently: Take 4 tablets by mouth at bedtime 11/11/23  Yes Pippin, Alveda Aures, APRN - NP   valACYclovir  (VALTREX ) 1 g tablet Take 1 tablet by mouth daily for 5 days as needed for outbreaks  Patient taking differently: Take 1 tablet by mouth daily as needed (outbreak) Take 1 tablet by mouth daily for 5  days as needed for outbreaks 06/02/23  Yes McMonigle, Ryan, DO   famotidine  (PEPCID ) 40 MG tablet Take 1 tablet by mouth every morning  Patient not taking: Reported on 05/07/2024 03/31/23   Bishop Bullock, DO        Allergies   Patient has no known allergies.    Social History     Social History       Tobacco History       Smoking Status  Never      Smokeless Tobacco Use  Never              Alcohol History       Alcohol Use Status  Yes Drinks/Week  2 Glasses of wine, 5 Cans of beer, 0 Shots of liquor, 0 Drinks containing 0.5 oz of alcohol per week Amount  7.0 standard drinks of alcohol/wk Comment  Either 1 beer or 1 glass of wine daily              Drug Use       Drug Use Status  Never              Sexual Activity       Sexually Active  Defer                    Family History     Family History   Problem Relation Age of Onset    Colon Cancer Mother         died 21       Review of Systems   Review of Systems   Constitutional: Negative.    HENT: Negative.     Eyes: Negative.    Respiratory: Negative.     Cardiovascular: Negative.    Endocrine: Negative.    Genitourinary: Negative.    Musculoskeletal: Negative.    Allergic/Immunologic: Negative.    Neurological: Negative.    Hematological: Negative.        Physical Exam   BP (!) 160/77   Pulse 64   Temp 97.7 F (36.5 C) (Oral)   Resp 16   Ht 1.537 m (5' 0.5)   Wt 64.4 kg (142 lb)   SpO2 100%   BMI 27.28 kg/m     Physical Exam  Constitutional:       Appearance: Normal appearance.   HENT:      Head: Normocephalic and atraumatic.      Nose: Nose  normal.      Mouth/Throat:      Mouth: Mucous membranes are moist.   Eyes:      Extraocular Movements: Extraocular movements intact.      Conjunctiva/sclera: Conjunctivae normal.      Pupils: Pupils are equal, round, and reactive to light.   Cardiovascular:      Rate and Rhythm: Normal rate and regular rhythm.      Pulses: Normal pulses.      Heart sounds: Normal heart sounds.   Pulmonary:      Effort: Pulmonary effort is  normal.      Breath sounds: Normal breath sounds.   Abdominal:      General: Abdomen is flat. Bowel sounds are normal.      Palpations: Abdomen is soft.   Musculoskeletal:         General: Normal range of motion.      Cervical back: Normal range of motion.   Skin:     General: Skin is warm and dry.   Neurological:      General: No focal deficit present.      Mental Status: She is alert. Mental status is at baseline.   Psychiatric:         Mood and Affect: Mood normal.         Labs    No results found for this or any previous visit (from the past 24 hours).     Imaging/Diagnostics Last 24 Hours   No results found.    Assessment        Plan   Colonoscopy     Consultations Ordered:  None    Electronically signed by Ceasar Codding, DO on 05/21/24 at 8:12 AM EDT

## 2024-06-11 NOTE — Telephone Encounter (Signed)
 Our records show you are (due/late) for your ROS medication refill. Please call your pharmacy to request this medication as soon as possible. If you need refills, please call Dr Reggy    If you have picked up your medicine already or this medicine is discontinued, please disregard.    We appreciate you partnering with us  for your healthcare needs.

## 2024-08-24 ENCOUNTER — Inpatient Hospital Stay: Admit: 2024-08-24 | Payer: Medicare (Managed Care) | Attending: Family Medicine | Primary: Family Medicine

## 2024-08-24 DIAGNOSIS — Z13820 Encounter for screening for osteoporosis: Secondary | ICD-10-CM

## 2024-08-27 ENCOUNTER — Encounter

## 2024-08-27 NOTE — Telephone Encounter (Signed)
**Note De-identified  [place] Obfuscation** Please advise 

## 2024-09-11 ENCOUNTER — Ambulatory Visit
Admit: 2024-09-11 | Discharge: 2024-09-11 | Payer: Medicare (Managed Care) | Attending: Family Medicine | Primary: Family Medicine

## 2024-09-11 NOTE — Assessment & Plan Note (Signed)
"  Blood pressure 128/66 in clinic today.  Continue spironolactone  50 mg daily.  Will get some updated lab work   "

## 2024-09-11 NOTE — Assessment & Plan Note (Signed)
"  Couple episodes of what patient perceives as excess sweating.  Will get some lab work to evaluate for any thyroid dysfunction or abnormalities on her CBC which would point towards an infectious or inflammatory process.    "

## 2024-09-11 NOTE — Progress Notes (Signed)
 "Erin Blackwell (DOB:  [birth year]) is a 73 y.o. female here for evaluation of the following chief complaint(s):  Sweats    SUBJECTIVE:  HPI  73 year old woman seen in clinic for concern about excess sweating.  States that she was at a party and was dancing outside when 2 separate people came up to her to tell her that she did not look good and was sweating profusely.  States that she felt fine at the time but was concerned about the amount that she was sweating.  Denies any other symptoms and states that she has otherwise felt good.  She has been doing pickleball, tai chi, and staying very active on a daily basis.  She has improved her diet over the past 6 months and has cut out sugar and processed food.  She denies any night sweats, fevers, shortness of breath, palpitations.    Review of Systems  See HPI for details    Past Medical History:   Diagnosis Date    Hyperlipidemia     Hypertension     Lab test positive for detection of COVID-19 virus 12/10/2021    Neuropathy 2021    after foot surgery    Screening for colon cancer     Ulcerative colitis (HCC)     Wears eyeglasses       Family History   Problem Relation Age of Onset    Colon Cancer Mother         died 61    No Known Problems Father     Prostate Cancer Brother     No Known Problems Daughter     No Known Problems Son       Social History     Socioeconomic History    Marital status: Widowed     Spouse name: Not on file    Number of children: Not on file    Years of education: Not on file    Highest education level: Not on file   Occupational History    Not on file   Tobacco Use    Smoking status: Never    Smokeless tobacco: Never   Vaping Use    Vaping status: Never Used   Substance and Sexual Activity    Alcohol use: Yes     Alcohol/week: 5.0 standard drinks of alcohol     Types: 4 Glasses of wine, 1 Cans of beer per week     Comment: Either 1 beer or 1 glass of wine daily    Drug use: Never    Sexual activity: Not Currently     Partners: Male   Other Topics  Concern    Not on file   Social History Narrative    Not on file     Social Drivers of Health     Financial Resource Strain: Not on file   Food Insecurity: Not on file   Transportation Needs: Not on file   Physical Activity: Sufficiently Active (11/19/2023)    Exercise Vital Sign     Days of Exercise per Week: 5 days     Minutes of Exercise per Session: 40 min   Stress: Not on file   Social Connections: Unknown (02/23/2020)    Received from [facility]    Social Connections     Frequency of Communication with Friends and Family: Not asked     Frequency of Social Gatherings with Friends and Family: Not asked   Intimate Partner Violence: Unknown (02/23/2020)    Received from  [facility]    Intimate Partner Violence     Fear of Current or Ex-Partner: Not asked     Emotionally Abused: Not asked     Physically Abused: Not asked     Sexually Abused: Not asked   Housing Stability: Not At Risk (02/12/2021)    Received from [facility] Stability     Was there a time when you did not have a steady place to sleep: Unrecognized value     Worried that the place you are staying is making you sick: Unrecognized value      Past Surgical History:   Procedure Laterality Date    ABDOMINOPLASTY      BREAST ENHANCEMENT SURGERY      since removed    CESAREAN SECTION      COLONOSCOPY      COLONOSCOPY N/A 02/15/2022    COLONOSCOPY POLYPECTOMY SNARE/COLD BIOPSY performed by Norleen Oneil [place], DO at [facility] ENDOSCOPY    COLONOSCOPY  02/15/2022    COLONOSCOPY WITH BIOPSY performed by Norleen Oneil [place], DO at [facility] ENDOSCOPY    COLONOSCOPY N/A 05/21/2024    COLONOSCOPY POLYPECTOMY REMOVAL SNARE/STOMA performed by [place] Norleen Oneil, DO at [facility] ENDOSCOPY    COLONOSCOPY  05/21/2024    COLONOSCOPY BIOPSY performed by [place] Norleen Oneil, DO at [facility] ENDOSCOPY    COLONOSCOPY N/A 05/21/2024    COLONOSCOPY CONTROL HEMORRHAGE performed by [place] Norleen Oneil, DO at [facility] ENDOSCOPY    ENDOSCOPY, COLON, DIAGNOSTIC      EYE SURGERY   cataracts    FOOT SURGERY Bilateral     UPPER GASTROINTESTINAL ENDOSCOPY N/A 02/15/2022    EGD BIOPSY performed by Norleen Oneil [place], DO at [facility] ENDOSCOPY        OBJECTIVE:  Physical Exam  BP 128/66   Pulse 60   Ht 1.524 m (5')   Wt 67.7 kg (149 lb 4.8 oz)   SpO2 96%   BMI 29.16 kg/m   General Appearance:  Well appearing, developed and nourished.  Appears stated age.  No acute distress.  Head:  Normocephalic, atraumatic.  Nose:  Nares patent, no lesions.  Skin:  No rashes noted.  Musculoskeletal:  Grossly full range of motion, no swelling or deformity.  Neurologic:  Grossly non focal.  Psych:  Cognitive function intact.  Judgement and insight normal.  Mood and affect appropriate.    No results found for any visits on 09/11/24.  ASSESSMENT/PLAN:  1. Primary hypertension  Assessment & Plan:  Blood pressure 128/66 in clinic today.  Continue spironolactone  50 mg daily.  Will get some updated lab work   Orders:  -     TSH reflex to FT4 (CERNER); Future  -     Cbc With Auto Differential; Future  -     Comprehensive Metabolic Panel; Future  2. Diaphoresis  Assessment & Plan:  Couple episodes of what patient perceives as excess sweating.  Will get some lab work to evaluate for any thyroid dysfunction or abnormalities on her CBC which would point towards an infectious or inflammatory process.        No follow-ups on file.    An electronic signature was used to authenticate this note.  Maretta Regal, DO  "

## 2024-09-14 ENCOUNTER — Encounter

## 2024-09-14 LAB — COMPREHENSIVE METABOLIC PANEL
ALT: 92 U/L — ABNORMAL HIGH (ref 0–42)
AST: 64 U/L — ABNORMAL HIGH (ref 0–46)
Albumin/Globulin Ratio: 2.6 (ref 1.00–2.70)
Albumin: 4.7 g/dL (ref 3.5–5.2)
Alk Phosphatase: 186 U/L — ABNORMAL HIGH (ref 35–117)
Anion Gap: 11 mmol/L (ref 2–17)
BUN: 15 mg/dL (ref 8–23)
CO2: 24 mmol/L (ref 22–29)
Calcium: 9.5 mg/dL (ref 8.5–10.7)
Chloride: 105 mmol/L (ref 98–107)
Creatinine: 0.8 mg/dL (ref 0.5–1.0)
Est, Glom Filt Rate: 79 mL/min/1.73m (ref >=60)
Globulin: 1.8 g/dL — ABNORMAL LOW (ref 1.9–4.4)
Glucose: 89 mg/dL (ref 70–99)
Osmolaliy Calculated: 280 mosm/kg (ref 270–287)
Potassium: 4.4 mmol/L (ref 3.5–5.3)
Sodium: 140 mmol/L (ref 135–145)
Total Bilirubin: 0.52 mg/dL (ref 0.00–1.20)
Total Protein: 6.5 g/dL (ref 5.7–8.3)

## 2024-09-14 LAB — CBC WITH AUTO DIFFERENTIAL
Basophils %: 1.1 % (ref 0.0–2.0)
Basophils Absolute: 0 x10e3/mcL (ref 0.0–0.2)
Eosinophils %: 0.8 % (ref 0.0–7.0)
Eosinophils Absolute: 0 x10e3/mcL (ref 0.0–0.5)
Hematocrit: 39.1 % (ref 34.0–47.0)
Hemoglobin: 12.6 g/dL (ref 11.5–15.7)
Immature Grans (Abs): 0 x10e3/mcL (ref 0.00–0.06)
Immature Granulocytes %: 0 % (ref 0.0–0.6)
Lymphocytes Absolute: 0.9 x10e3/mcL — ABNORMAL LOW (ref 1.0–3.2)
Lymphocytes: 33.5 % (ref 15.0–45.0)
MCH: 31 pg (ref 27.0–34.5)
MCHC: 32.2 g/dL (ref 30.0–36.0)
MCV: 96.3 fL (ref 81.0–99.0)
MPV: 10.6 fL (ref 7.0–12.2)
Monocytes %: 12.2 % — ABNORMAL HIGH (ref 4.0–12.0)
Monocytes Absolute: 0.3 x10e3/mcL (ref 0.3–1.0)
NRBC Absolute: 0 x10e3/mcL (ref 0.000–0.012)
NRBC Automated: 0 % (ref 0.0–0.2)
Neutrophils %: 52.4 % (ref 42.0–74.0)
Neutrophils Absolute: 1.4 x10e3/mcL — ABNORMAL LOW (ref 1.6–7.3)
Platelets: 207 x10e3/mcL (ref 140–440)
RBC: 4.06 x10e6/mcL (ref 3.60–5.20)
RDW: 13.7 % (ref 10.0–17.0)
WBC: 2.6 x10e3/mcL — ABNORMAL LOW (ref 3.8–10.6)

## 2024-09-14 LAB — HEPATITIS PANEL, ACUTE
Hep A IgM: NEGATIVE
Hep B Core Ab, IgM: NEGATIVE
Hepatitis B Surface Ag: NEGATIVE
Hepatitis C Ab: NEGATIVE

## 2024-09-14 LAB — TSH REFLEX TO FT4: TSH: 2.65 u[IU]/mL (ref 0.358–3.740)

## 2024-09-14 LAB — SEDIMENTATION RATE: Sed Rate, Automated: <1 mm/h (ref 0–30)

## 2024-09-14 LAB — GAMMA GT: GGT: 174 U/L — ABNORMAL HIGH (ref 5–36)

## 2024-09-14 LAB — C-REACTIVE PROTEIN: CRP: <0.3 mg/dL (ref 0.00–0.50)

## 2024-09-20 LAB — GAMMOPATHY EVAL, SPEP/IFE, IG QT/FLC
Albumin/Globulin Ratio: 1.6 (ref 0.7–1.7)
Albumin: 4.1 g/dL (ref 2.9–4.4)
Alpha-1-Globulin: 0.3 g/dL (ref 0.0–0.4)
Alpha-2-Globulin: 0.6 g/dL (ref 0.4–1.0)
Beta Globulin: 1.1 g/dL (ref 0.7–1.3)
Gamma Globulin: 0.7 g/dL (ref 0.4–1.8)
Globulin, Total: 2.6 g/dL (ref 2.2–3.9)
IgA, Total: 156 mg/dL (ref 64–422)
IgG: 759 mg/dL (ref 586–1602)
IgM: 31 mg/dL (ref 26–217)
Kappa Free Light Chains QNT: 6.8 mg/L (ref 3.3–19.4)
Kappa/Lambda Free Light Chain Ratio: 0.64 (ref 0.26–1.65)
Lambda Free Light Chains QNT: 10.6 mg/L (ref 5.7–26.3)
Total Protein: 6.7 g/dL (ref 6.0–8.5)

## 2024-10-05 ENCOUNTER — Encounter

## 2024-10-08 ENCOUNTER — Ambulatory Visit
Admit: 2024-10-08 | Discharge: 2024-10-08 | Payer: Medicare (Managed Care) | Attending: Gerontology | Primary: Family Medicine

## 2024-10-08 ENCOUNTER — Other Ambulatory Visit: Admit: 2024-10-08 | Discharge: 2024-10-08 | Payer: Medicare (Managed Care) | Primary: Family Medicine

## 2024-10-08 ENCOUNTER — Encounter

## 2024-10-08 VITALS — BP 142/76 | HR 63 | Temp 98.00000°F | Ht 60.0 in | Wt 148.1 lb

## 2024-10-08 DIAGNOSIS — D72819 Decreased white blood cell count, unspecified: Secondary | ICD-10-CM

## 2024-10-08 DIAGNOSIS — D709 Neutropenia, unspecified: Principal | ICD-10-CM

## 2024-10-08 LAB — HEPATIC FUNCTION PANEL
ALT: 61 U/L — ABNORMAL HIGH (ref 0–42)
AST: 65 U/L — ABNORMAL HIGH (ref 0–46)
Albumin: 4.8 g/dL (ref 3.5–5.2)
Alk Phosphatase: 125 U/L — ABNORMAL HIGH (ref 35–117)
Bilirubin, Direct: 0.1 mg/dL (ref 0.00–0.30)
Total Bilirubin: 0.48 mg/dL (ref 0.00–1.20)
Total Protein: 7.3 g/dL (ref 6.4–8.3)

## 2024-10-08 LAB — BASIC METABOLIC PANEL
Anion Gap: 12 mmol/L (ref 2–17)
BUN: 24 mg/dL — ABNORMAL HIGH (ref 8–23)
CO2: 24 mmol/L (ref 22–29)
Calcium: 10 mg/dL (ref 8.8–10.2)
Chloride: 106 mmol/L (ref 98–107)
Creatinine: 0.9 mg/dL (ref 0.5–1.0)
Est, Glom Filt Rate: 68 mL/min/1.73mÂ² (ref >=60)
Glucose: 101 mg/dL — ABNORMAL HIGH (ref 70–99)
Potassium: 4.1 mmol/L (ref 3.5–5.3)
Sodium: 142 mmol/L (ref 135–145)

## 2024-10-08 LAB — CBC WITH AUTO DIFFERENTIAL
Basophils %: 0.8 % (ref 0.0–2.0)
Basophils Absolute: 0 x10e3/mcL (ref 0.0–0.2)
Eosinophils %: 2.4 % (ref 0.0–7.0)
Eosinophils Absolute: 0.1 x10e3/mcL (ref 0.0–0.5)
Hematocrit: 37.1 % (ref 34.0–47.0)
Hemoglobin: 12.1 g/dL (ref 11.5–15.7)
Immature Grans (Abs): 0.01 x10e3/mcL (ref 0.00–0.06)
Immature Granulocytes %: 0.3 % (ref 0.0–0.6)
Lymphocytes Absolute: 1.1 x10e3/mcL (ref 1.0–3.2)
Lymphocytes: 29.5 % (ref 15.0–45.0)
MCH: 31.1 pg (ref 27.0–34.5)
MCHC: 32.6 g/dL (ref 30.0–36.0)
MCV: 95.4 fL (ref 81.0–99.0)
MPV: 9.4 fL (ref 7.0–12.2)
Monocytes %: 11.1 % (ref 4.0–12.0)
Monocytes Absolute: 0.4 x10e3/mcL (ref 0.3–1.0)
Neutrophils %: 55.9 % (ref 42.0–74.0)
Neutrophils Absolute: 2.1 x10e3/mcL (ref 1.6–7.3)
Platelets: 207 x10e3/mcL (ref 140–440)
RBC: 3.89 x10e3/mcL (ref 3.60–5.20)
RDW: 12.3 % (ref 10.0–17.0)
WBC: 3.7 x10e3/mcL — ABNORMAL LOW (ref 3.8–10.6)

## 2024-10-08 NOTE — Progress Notes (Signed)
 "    Patient Name: Erin Blackwell Attending: Rosina RONAL Mask, MD   DOB: 12-12-1950  Age:73 y.o. ERE:FrFnwphoz, Bernardino, DO    Date of Visit: 10/08/2024      Diagnosis   Neutropenia, unspecified type [D70.9]     Treatment History     Chief Complaint   New Patient       History of Present Illness   Erin Blackwell is a 73 y.o. female referred by Dr. Bernardino Regal, for evaluation of neutropenia. On 09/14/24, Her WBC count was 2.6 with an ANC of 1.4 and ALC of 0.9.  Patient tells me that she feels, great.  She denies recent illness, fever, chills and fatigue.  She remains very active.  This morning she participated in tai chi which was followed by pickleball.  She denies lymphadenopathy, drenching night sweats and unexplained weight loss.  Review of Systems   10 point ROS as per history of present illness. All others reviewed and negative.    Past Medical History         Diagnosis Date    Hyperlipidemia     Hypertension     Lab test positive for detection of COVID-19 virus 12/10/2021    Neuropathy 2021    after foot surgery    Screening for colon cancer     Ulcerative colitis (HCC)     Wears eyeglasses       Past Surgical History         Procedure Laterality Date    ABDOMINOPLASTY      BREAST ENHANCEMENT SURGERY      since removed    CESAREAN SECTION      COLONOSCOPY      COLONOSCOPY N/A 02/15/2022    COLONOSCOPY POLYPECTOMY SNARE/COLD BIOPSY performed by Norleen Oneil Athens, DO at [facility] ENDOSCOPY    COLONOSCOPY  02/15/2022    COLONOSCOPY WITH BIOPSY performed by Norleen Oneil Athens, DO at [facility] ENDOSCOPY    COLONOSCOPY N/A 05/21/2024    COLONOSCOPY POLYPECTOMY REMOVAL SNARE/STOMA performed by Athens Norleen Oneil, DO at [facility] ENDOSCOPY    COLONOSCOPY  05/21/2024    COLONOSCOPY BIOPSY performed by Athens Norleen Oneil, DO at [facility] ENDOSCOPY    COLONOSCOPY N/A 05/21/2024    COLONOSCOPY CONTROL HEMORRHAGE performed by Athens Norleen Oneil, DO at [facility] ENDOSCOPY    ENDOSCOPY, COLON, DIAGNOSTIC      EYE SURGERY  cataracts    FOOT  SURGERY Bilateral     TUBAL LIGATION      UPPER GASTROINTESTINAL ENDOSCOPY N/A 02/15/2022    EGD BIOPSY performed by Norleen Oneil Athens, DO at [facility] ENDOSCOPY      Social History    reports that she has never smoked. She has never used smokeless tobacco. She reports current alcohol use of about 5.0 standard drinks of alcohol per week. She reports that she does not use drugs.   Family History         Problem Relation Age of Onset    Colon Cancer Mother 21        died 76    No Known Problems Father     Prostate Cancer Brother 60    Cancer Brother 72 - 69        Prostate    No Known Problems Daughter     No Known Problems Son       Allergies   No Known Allergies   Medications     Current Outpatient Medications   Medication Sig Dispense Refill  Multiple Vitamins-Minerals (DAILY MULTIVITAMIN PO) Take 1 tablet by mouth daily      Magnesium ([employer]S TRIPLE MAGNESIUM COMPLEX) 400 MG CAPS Take 1 capsule by mouth daily      Ascorbic Acid (VITAMIN C) 1000 MG tablet Take 1 tablet by mouth daily      Omega-3 Fatty Acids (OMEGA-3 PO) Take 2 capsules by mouth daily      NONFORMULARY Take 1 capsule by mouth daily MultiHerbal Supplement      spironolactone  (ALDACTONE ) 50 MG tablet Take 1 tablet by mouth every evening 90 tablet 4    valACYclovir  (VALTREX ) 1 g tablet Take 1 tablet by mouth daily for 5 days as needed for outbreaks 30 tablet 0    folic acid  (FOLVITE ) 1 MG tablet Take 1 tablet by mouth every evening (Patient not taking: Reported on 10/08/2024) 90 tablet 4    rosuvastatin  (CRESTOR ) 10 MG tablet Take 1 tablet by mouth every evening (Patient not taking: Reported on 10/08/2024) 90 tablet 4    sulfaSALAzine  (AZULFIDINE ) 500 MG tablet Take 4 tablets by mouth 3 times daily (Patient not taking: Reported on 10/08/2024) 1080 tablet 1     No current facility-administered medications for this visit.     Physical Exam     Vitals:    10/08/24 1334   BP: (!) 142/76   Pulse: 63   Temp: 98 F (36.7 C)   SpO2: 96%   Weight: 67.2 kg (148 lb  1.6 oz)   Height: 1.524 m (5')      General: No acute distress  HEENT: Normocephalic, EOMI, PERRL, external ears and nose without abnormality  Neck: No tenderness, No palpable mass  Respiratory: Bilateral lung fields clear to auscultation. No accessory muscle use.  Cardiovascular: Regular rate and rhythm. No murmurs. No peripheral edema.  Abdomen: Normal active bowel sounds. Soft. No tenderness.  Lymph nodes: No cervical, supraclavicular, or axillary adenopathy.  Skin: No rash. No petechiae or purpura. No cutaneous nodules.   Musculoskeletal: No bony tenderness. No deformities.   Neurological: No sensory or motor deficit. AAO x 3.   Psychiatric: Mood appropriate for situation. Appropriate judgement and insight.    LABS AND IMAGING:     Recent Labs     10/08/24  1315   WBC 3.7*   RBC 3.89   HGB 12.1   HCT 37.1   MCV 95.4   RDW 12.3   PLT 207     Recent Labs     10/08/24  1315   NA 142   K 4.1   CL 106   CO2 24   BUN 24*   CREATININE 0.9   GLUCOSE 101*     Recent Labs     10/08/24  1315   AST 65*   ALT 61*   BILIDIR 0.10   BILITOT 0.48   ALKPHOS 125*       No results for input(s): PROTIME, INR in the last 72 hours.  No results for input(s): APTT in the last 72 hours.  No results found for: FIBRINOGEN   No results found for: LABURIC, URICACID   No results found for: LDH     No results found for: IRON, TIBC, FERRITIN   No results found for: VITAMINB12   No results found for: FOLATE     ASSESSMENT AND PLAN:   Neutropenia: WBC count has recovered, ANC and lymphocyte count normal.  I am checking B12 and folate level today, results pending.  I will contact patient if there  are any deficiencies.  I offered patient a follow-up visit in 3-4 months but she prefers to have her laboratories repeated with Dr. Reggy.  We will be happy to see Erin Blackwell in the future if needed.    Transaminitis: According to patient, sulfasalazine  was discontinued to to transaminitis.  She has a history of ulcerative colitis  and is followed by Dr. Norleen Athens.  Patient is currently taking folic acid  p.o. daily in conjunction with sulfasalazine .     Return if symptoms worsen or fail to improve.  No orders of the defined types were placed in this encounter.    No orders of the defined types were placed in this encounter.         Erin Wanda Addison, APRN - NP   [facility]  [phone]  [phone]  "

## 2024-10-09 LAB — FOLATE: Folate: >20 ng/mL (ref 4.80–24.20)

## 2024-10-09 LAB — VITAMIN B12: Vitamin B-12: 426 pg/mL (ref 232–1245)

## 2024-11-15 ENCOUNTER — Encounter

## 2024-11-16 LAB — CBC WITH AUTO DIFFERENTIAL
Basophils %: 1 % (ref 0.0–2.0)
Basophils Absolute: 0 x10e3/mcL (ref 0.0–0.2)
Eosinophils %: 4.7 % (ref 0.0–7.0)
Eosinophils Absolute: 0.1 x10e3/mcL (ref 0.0–0.5)
Hematocrit: 38.4 % (ref 34.0–47.0)
Hemoglobin: 12.6 g/dL (ref 11.5–15.7)
Immature Grans (Abs): 0 x10e3/mcL (ref 0.00–0.06)
Immature Granulocytes %: 0 % (ref 0.0–0.6)
Lymphocytes Absolute: 0.8 x10e3/mcL — ABNORMAL LOW (ref 1.0–3.2)
Lymphocytes: 25 % (ref 15.0–45.0)
MCH: 29 pg (ref 27.0–34.5)
MCHC: 32.8 g/dL (ref 30.0–36.0)
MCV: 88.5 fL (ref 81.0–99.0)
MPV: 10.5 fL (ref 7.0–12.2)
Monocytes %: 16.3 % — ABNORMAL HIGH (ref 4.0–12.0)
Monocytes Absolute: 0.5 x10e3/mcL (ref 0.3–1.0)
NRBC Absolute: 0 x10e3/mcL (ref 0.00–0.01)
NRBC Automated: 0 % (ref 0.0–0.2)
Neutrophils %: 53 % (ref 42.0–74.0)
Neutrophils Absolute: 1.6 x10e3/mcL (ref 1.6–7.3)
Platelets: 209 x10e3/mcL (ref 140–440)
RBC: 4.34 x10e6/mcL (ref 3.60–5.20)
RDW: 13.2 % (ref 10.0–17.0)
WBC: 3 x10e3/mcL — ABNORMAL LOW (ref 3.8–10.6)

## 2024-11-16 LAB — COMPREHENSIVE METABOLIC PANEL
ALT: 36 U/L (ref 0–42)
AST: 38 U/L (ref 0–46)
Albumin/Globulin Ratio: 2.2 (ref 1.00–2.70)
Albumin: 4.2 g/dL (ref 3.5–5.2)
Alk Phosphatase: 108 U/L (ref 35–117)
Anion Gap: 9 mmol/L (ref 2–17)
BUN: 17 mg/dL (ref 8–23)
CO2: 24 mmol/L (ref 22–29)
Calcium: 9.4 mg/dL (ref 8.5–10.7)
Chloride: 104 mmol/L (ref 98–107)
Creatinine: 0.8 mg/dL (ref 0.5–1.0)
Est, Glom Filt Rate: 79 mL/min/1.73m?? (ref >=60)
Globulin: 1.9 g/dL (ref 1.9–4.4)
Glucose: 95 mg/dL (ref 70–99)
Osmolaliy Calculated: 275 mosm/kg (ref 270–287)
Potassium: 4.5 mmol/L (ref 3.5–5.3)
Sodium: 137 mmol/L (ref 135–145)
Total Bilirubin: 0.33 mg/dL (ref 0.00–1.20)
Total Protein: 6.1 g/dL (ref 5.7–8.3)

## 2024-11-16 LAB — LIPID PANEL
Chol/HDL Ratio: 3.2 (ref 0.0–4.4)
Cholesterol, Total: 210 mg/dL — ABNORMAL HIGH (ref 100–200)
HDL: 65 mg/dL (ref >=50)
LDL Cholesterol: 129.8 mg/dL — ABNORMAL HIGH (ref 0.0–100.0)
LDL/HDL Ratio: 2
Triglycerides: 76 mg/dL (ref 0–149)
VLDL: 15.2 mg/dL (ref 5.0–40.0)

## 2024-11-16 LAB — BILIRUBIN, DIRECT: Bilirubin, Direct: <0.2 mg/dL (ref 0.00–0.30)

## 2024-11-16 LAB — VITAMIN D 25 HYDROXY: Vit D, 25-Hydroxy: 50.5 ng/mL (ref 30.0–90.0)

## 2024-11-22 ENCOUNTER — Ambulatory Visit
Admit: 2024-11-22 | Discharge: 2024-11-22 | Payer: Medicare (Managed Care) | Attending: Family Medicine | Primary: Family Medicine

## 2024-11-22 NOTE — Assessment & Plan Note (Signed)
"  Patient is followed by gastroenterology Dr. Melvinia.  Was on sulfasalazine  but discontinued after recent lab work showed elevation in her liver enzymes and some abnormalities with her lymphocytes.  Her liver enzymes have improved back to normal range off of the medication.  She has not had any significant GI symptoms since discontinuing the medication.  Advised to continue to monitor and if symptoms start to recur then she can follow back up with Dr. Melvinia to discuss other medication options.    "

## 2024-11-22 NOTE — Assessment & Plan Note (Signed)
"  Patient discontinued her rosuvastatin  because she was afraid of some of the potential side effects.  She does not want to go back on any statin medication.  Her lipid panel did reflect this with an elevation in her total and LDL cholesterol.  Will continue to monitor her labs annually.   The 10-year ASCVD risk score (Arnett DK, et al., 2019) is: 9.2%    Values used to calculate the score:      Age: 73 years      Clinically relevant sex: Female      Is Non-Hispanic African American: No      Diabetic: No      Tobacco smoker: No      Systolic Blood Pressure: 120 mmHg      Is BP treated: No      HDL Cholesterol: 65 mg/dL      Total Cholesterol: 210 mg/dL   "

## 2024-11-22 NOTE — Progress Notes (Signed)
 "Medicare Annual Wellness Visit    Erin Blackwell is here for Medicare AWV    Assessment & Plan   Medicare annual wellness visit, subsequent  Assessment & Plan:  Medicare annual wellness visit completed.  Past medical history and medication list updated and reviewed with patient.  Cognitive screening completed with patient.  Mammogram scheduled for 11/26/2024.  Follow-up in 1 year or sooner as needed.    Primary hypertension  Assessment & Plan:  Blood pressure 120/72 in clinic today.  Patient has been weaning herself off of spironolactone  and is now taking 25 mg every other day.  Will have her discontinue the medication and continue to monitor her blood pressure   Ulcerative colitis without complications, unspecified location [facility])  Assessment & Plan:  Patient is followed by gastroenterology Dr. Melvinia.  Was on sulfasalazine  but discontinued after recent lab work showed elevation in her liver enzymes and some abnormalities with her lymphocytes.  Her liver enzymes have improved back to normal range off of the medication.  She has not had any significant GI symptoms since discontinuing the medication.  Advised to continue to monitor and if symptoms start to recur then she can follow back up with Dr. Melvinia to discuss other medication options.    Hyperlipidemia, unspecified hyperlipidemia type  Assessment & Plan:  Patient discontinued her rosuvastatin  because she was afraid of some of the potential side effects.  She does not want to go back on any statin medication.  Her lipid panel did reflect this with an elevation in her total and LDL cholesterol.  Will continue to monitor her labs annually.   The 10-year ASCVD risk score (Arnett DK, et al., 2019) is: 9.2%    Values used to calculate the score:      Age: 70 years      Clinically relevant sex: Female      Is Non-Hispanic African American: No      Diabetic: No      Tobacco smoker: No      Systolic Blood Pressure: 120 mmHg      Is BP treated: No      HDL  Cholesterol: 65 mg/dL      Total Cholesterol: 210 mg/dL        Return in about 1 year (around 11/22/2025) for medicare AWV.     Subjective   The following acute and/or chronic problems were also addressed today:  73 year old woman seen in clinic for Medicare annual wellness visit.  States she feels much better off of the sulfasalazine  and has not had any significant GI symptoms since discontinuing the medication.  She has also been weaning herself down on spironolactone  and is currently taking 25 mg every other day.  She also discontinued the rosuvastatin  because of some of the potential side effects that she was reading about.    Patient's complete Health Risk Assessment and screening values have been reviewed and are found in Flowsheets. The following problems were reviewed today and where indicated follow up appointments were made and/or referrals ordered.    No Positive Risk Factors identified today.                                     Objective   Vitals:    11/22/24 1001   BP: 120/72   Pulse: 53   Resp: 18   SpO2: 95%   Weight: 68.4 kg (150 lb 14.4 oz)  Height: 1.524 m (5')      Body mass index is 29.47 kg/m.      General Appearance: alert and oriented to person, place and time, well-developed and well-nourished, in no acute distress  Skin: warm and dry, no rash or erythema  Head: normocephalic and atraumatic  Eyes: pupils equal, round, and reactive to light, extraocular eye movements intact, conjunctivae normal  ENT: tympanic membrane, external ear and ear canal normal bilaterally, oropharynx clear and moist with normal mucous membranes  Pulmonary/Chest: clear to auscultation bilaterally- no wheezes, rales or rhonchi, normal air movement, no respiratory distress  Cardiovascular: normal rate, normal S1 and S2  Extremities: no cyanosis and no clubbing  Musculoskeletal: normal range of motion, no joint swelling, deformity or tenderness  Neurologic: gait and coordination normal and speech normal          No  Known Allergies  Prior to Visit Medications   Medication Sig Taking? Authorizing Provider   Multiple Vitamins-Minerals (DAILY MULTIVITAMIN PO) Take 1 tablet by mouth daily Yes [provider]   Magnesium ([employer]S TRIPLE MAGNESIUM COMPLEX) 400 MG CAPS Take 1 capsule by mouth daily Yes [provider]   Ascorbic Acid (VITAMIN C) 1000 MG tablet Take 1 tablet by mouth daily Yes [provider]   Omega-3 Fatty Acids (OMEGA-3 PO) Take 2 capsules by mouth daily Yes [provider]   NONFORMULARY Take 1 capsule by mouth daily MultiHerbal Supplement Yes [provider]   valACYclovir  (VALTREX ) 1 g tablet Take 1 tablet by mouth daily for 5 days as needed for outbreaks Yes Laetitia Schnepf, DO       CareTeam (Including outside providers/suppliers regularly involved in providing care):   Patient Care Team:  Reggy Motto, DO as PCP - General (Family Medicine)  Reggy Motto, DO as PCP - Empaneled Provider  Melvinia, Norleen Anes, DO as Surgeon (Gastroenterology)  Secundino Knee Abide, MD as Hematology/Oncology (Hematology and Oncology)     Recommendations for Preventive Services Due: see orders and patient instructions/AVS.  Recommended screening schedule for the next 5-10 years is provided to the patient in written form: see Patient Instructions/AVS.     Reviewed and updated this visit:  Allergies  Meds               "

## 2024-11-22 NOTE — Assessment & Plan Note (Signed)
"  Medicare annual wellness visit completed.  Past medical history and medication list updated and reviewed with patient.  Cognitive screening completed with patient.  Mammogram scheduled for 11/26/2024.  Follow-up in 1 year or sooner as needed.    "

## 2024-11-22 NOTE — Assessment & Plan Note (Signed)
"  Blood pressure 120/72 in clinic today.  Patient has been weaning herself off of spironolactone  and is now taking 25 mg every other day.  Will have her discontinue the medication and continue to monitor her blood pressure   "

## 2024-11-22 NOTE — Patient Instructions (Signed)
 "     A Healthy Heart: Care Instructions  Overview    Coronary artery disease, also called heart disease, occurs when a substance called plaque builds up in the vessels that supply oxygen-rich blood to your heart muscle. This can narrow the blood vessels and reduce blood flow. A heart attack happens when blood flow is completely blocked. A high-fat diet, smoking, and other factors increase the risk of heart disease.  Your doctor has found that you have a chance of having heart disease. A heart-healthy lifestyle can help keep your heart healthy and prevent heart disease. This lifestyle includes eating healthy, being active, staying at a weight that's healthy for you, and not smoking, vaping, or using other tobacco or nicotine products. It also includes taking medicines as directed, managing other health conditions, and trying to get a healthy amount of sleep.  Follow-up care is a key part of your treatment and safety. Be sure to make and go to all appointments, and contact your doctor if you are having problems. It's also a good idea to know your test results and keep a list of the medicines you take.  How can you care for yourself at home?  Diet  Use less salt when you cook and eat. This helps lower your blood pressure. Taste food before salting. Add only a little salt when you think you need it. With time, your taste buds will adjust to less salt.  Eat fewer snack items, fast foods, canned soups, and other high-salt, high-fat, processed foods.  Read food labels and try to avoid saturated and trans fats. They increase your risk of heart disease by raising cholesterol levels.  Limit the amount of solid fat--butter, margarine, and shortening--you eat. Use olive, peanut, or canola oil when you cook. Bake, broil, and steam foods instead of frying them.  Eat a variety of fruit and vegetables every day. Dark green, deep orange, red, or yellow fruits and vegetables are especially good for you. Examples include spinach,  carrots, peaches, and berries.  Foods high in fiber can reduce your cholesterol and provide important vitamins and minerals. High-fiber foods include whole-grain cereals and breads, oatmeal, beans, brown rice, citrus fruits, and apples.  Eat lean proteins. Heart-healthy proteins include seafood, lean meats and poultry, eggs, beans, peas, nuts, seeds, and soy products.  Limit drinks and foods with added sugar. These include candy, desserts, and soda pop.  Heart-healthy lifestyle  If your doctor recommends it, get more exercise. For many people, walking is a good choice. Or you may want to swim, bike, or do other activities. Bit by bit, increase the time you're active every day. Try for at least 30 minutes on most days of the week.  If you smoke, vape, or use other tobacco or nicotine products, try to quit. If you cant quit, cut back as much as you can. If you need help quitting, talk to your doctor about quit programs and medicines. Quitting is one of the most important things you can do to protect your heart. Also avoid secondhand smoke and the aerosol mist from vaping.  Stay at a weight that's healthy for you. Talk to your doctor if you need help losing weight.  Try to get 7 to 9 hours of sleep each night.  Limit alcohol to 2 drinks a day for men and 1 drink a day for women. Too much alcohol can cause health problems.  Manage other health problems such as diabetes, high blood pressure, and high cholesterol. If  you think you may have a problem with alcohol or drug use, talk to your doctor.  Medicines  Take your medicines exactly as prescribed. Contact your doctor if you think you are having a problem with your medicine.  When should you call for help?  Call 911 if you have symptoms of a heart attack. These may include:  Chest pain or pressure, or a strange feeling in the chest.  Sweating.  Shortness of breath.  Pain, pressure, or a strange feeling in the back, neck, jaw, or upper belly or in one or both shoulders  or arms.  Lightheadedness or sudden weakness.  A fast or irregular heartbeat.  After you call 911, the operator may tell you to chew 1 adult-strength or 2 to 4 low-dose aspirin. Wait for an ambulance. Do not try to drive yourself.  Watch closely for changes in your health, and be sure to contact your doctor if you have any problems.  Where can you learn more?  Go to [employer] and enter F075 to learn more about A Healthy Heart: Care Instructions.  Current as of: July 06, 2023  Content Version: 14.6   2024-2025 [place], [place].   Care instructions adapted under license by [facility]. If you have questions about a medical condition or this instruction, always ask your healthcare professional. Romayne Alderman, [facility], disclaims any warranty or liability for your use of this information.    Personalized Preventive Plan for Erin Blackwell - 11/22/2024  Medicare offers a range of preventive health benefits. Some of the tests and screenings are paid in full while other may be subject to a deductible, co-insurance, and/or copay.  Some of these benefits include a comprehensive review of your medical history including lifestyle, illnesses that may run in your family, and various assessments and screenings as appropriate.  After reviewing your medical record and screening and assessments performed today your provider may have ordered immunizations, labs, imaging, and/or referrals for you.  A list of these orders (if applicable) as well as your Preventive Care list are included within your After Visit Summary for your review.      "

## 2024-11-26 ENCOUNTER — Inpatient Hospital Stay: Admit: 2024-11-26 | Payer: Medicare (Managed Care) | Attending: Family Medicine | Primary: Family Medicine

## 2024-11-26 DIAGNOSIS — Z1231 Encounter for screening mammogram for malignant neoplasm of breast: Secondary | ICD-10-CM
